# Patient Record
Sex: Male | Born: 1975 | Hispanic: No | Marital: Single | Smoking: Former smoker
Health system: Southern US, Community
[De-identification: ages and names within clinical notes are randomized; demographics above are authoritative.]

## PROBLEM LIST (undated history)

## (undated) DIAGNOSIS — D72829 Elevated white blood cell count, unspecified: Secondary | ICD-10-CM

## (undated) DIAGNOSIS — Z720 Tobacco use: Secondary | ICD-10-CM

## (undated) DIAGNOSIS — R748 Abnormal levels of other serum enzymes: Secondary | ICD-10-CM

## (undated) DIAGNOSIS — J9600 Acute respiratory failure, unspecified whether with hypoxia or hypercapnia: Secondary | ICD-10-CM

## (undated) DIAGNOSIS — R6510 Systemic inflammatory response syndrome (SIRS) of non-infectious origin without acute organ dysfunction: Secondary | ICD-10-CM

## (undated) DIAGNOSIS — R000 Tachycardia, unspecified: Secondary | ICD-10-CM

## (undated) HISTORY — DX: Tobacco use: Z72.0

## (undated) HISTORY — DX: Tachycardia, unspecified: R00.0

## (undated) HISTORY — DX: Elevated white blood cell count, unspecified: D72.829

## (undated) HISTORY — DX: Acute respiratory failure, unspecified whether with hypoxia or hypercapnia: J96.00

## (undated) HISTORY — DX: Abnormal levels of other serum enzymes: R74.8

## (undated) HISTORY — PX: OTHER SURGICAL HISTORY: SHX169

## (undated) HISTORY — DX: Systemic inflammatory response syndrome (sirs) of non-infectious origin without acute organ dysfunction: R65.10

---

## 2002-01-21 ENCOUNTER — Emergency Department (HOSPITAL_COMMUNITY): Admission: EM | Admit: 2002-01-21 | Discharge: 2002-01-21 | Payer: Self-pay | Admitting: Emergency Medicine

## 2002-01-28 ENCOUNTER — Emergency Department (HOSPITAL_COMMUNITY): Admission: EM | Admit: 2002-01-28 | Discharge: 2002-01-28 | Payer: Self-pay | Admitting: Emergency Medicine

## 2004-01-20 ENCOUNTER — Emergency Department (HOSPITAL_COMMUNITY): Admission: EM | Admit: 2004-01-20 | Discharge: 2004-01-20 | Payer: Self-pay | Admitting: Emergency Medicine

## 2011-06-09 ENCOUNTER — Inpatient Hospital Stay (HOSPITAL_BASED_OUTPATIENT_CLINIC_OR_DEPARTMENT_OTHER)
Admission: EM | Admit: 2011-06-09 | Discharge: 2011-06-20 | DRG: 871 | Disposition: A | Discharge status: Home / Self Care | Payer: Self-pay | Source: Ambulatory Visit | Attending: Pulmonary Disease | Admitting: Pulmonary Disease

## 2011-06-09 ENCOUNTER — Encounter: Payer: Self-pay | Admitting: *Deleted

## 2011-06-09 ENCOUNTER — Emergency Department (INDEPENDENT_AMBULATORY_CARE_PROVIDER_SITE_OTHER): Payer: Self-pay

## 2011-06-09 DIAGNOSIS — R748 Abnormal levels of other serum enzymes: Secondary | ICD-10-CM | POA: Diagnosis not present

## 2011-06-09 DIAGNOSIS — J189 Pneumonia, unspecified organism: Secondary | ICD-10-CM | POA: Diagnosis present

## 2011-06-09 DIAGNOSIS — R059 Cough, unspecified: Secondary | ICD-10-CM

## 2011-06-09 DIAGNOSIS — J9600 Acute respiratory failure, unspecified whether with hypoxia or hypercapnia: Secondary | ICD-10-CM | POA: Diagnosis present

## 2011-06-09 DIAGNOSIS — D72829 Elevated white blood cell count, unspecified: Secondary | ICD-10-CM | POA: Diagnosis present

## 2011-06-09 DIAGNOSIS — F17200 Nicotine dependence, unspecified, uncomplicated: Secondary | ICD-10-CM | POA: Diagnosis present

## 2011-06-09 DIAGNOSIS — J984 Other disorders of lung: Secondary | ICD-10-CM | POA: Diagnosis present

## 2011-06-09 DIAGNOSIS — A419 Sepsis, unspecified organism: Principal | ICD-10-CM | POA: Diagnosis present

## 2011-06-09 DIAGNOSIS — R0602 Shortness of breath: Secondary | ICD-10-CM

## 2011-06-09 DIAGNOSIS — E876 Hypokalemia: Secondary | ICD-10-CM | POA: Diagnosis present

## 2011-06-09 DIAGNOSIS — E869 Volume depletion, unspecified: Secondary | ICD-10-CM | POA: Diagnosis present

## 2011-06-09 DIAGNOSIS — F1210 Cannabis abuse, uncomplicated: Secondary | ICD-10-CM | POA: Diagnosis present

## 2011-06-09 DIAGNOSIS — E871 Hypo-osmolality and hyponatremia: Secondary | ICD-10-CM | POA: Diagnosis present

## 2011-06-09 DIAGNOSIS — R509 Fever, unspecified: Secondary | ICD-10-CM

## 2011-06-09 DIAGNOSIS — Z720 Tobacco use: Secondary | ICD-10-CM | POA: Diagnosis present

## 2011-06-09 DIAGNOSIS — J9601 Acute respiratory failure with hypoxia: Secondary | ICD-10-CM | POA: Diagnosis present

## 2011-06-09 DIAGNOSIS — R05 Cough: Secondary | ICD-10-CM

## 2011-06-09 DIAGNOSIS — R6510 Systemic inflammatory response syndrome (SIRS) of non-infectious origin without acute organ dysfunction: Secondary | ICD-10-CM | POA: Diagnosis present

## 2011-06-09 DIAGNOSIS — R000 Tachycardia, unspecified: Secondary | ICD-10-CM | POA: Diagnosis present

## 2011-06-09 LAB — COMPREHENSIVE METABOLIC PANEL
ALT: 30 U/L (ref 0–53)
AST: 29 U/L (ref 0–37)
Albumin: 3.4 g/dL — ABNORMAL LOW (ref 3.5–5.2)
Alkaline Phosphatase: 104 U/L (ref 39–117)
Potassium: 3.9 mEq/L (ref 3.5–5.1)
Sodium: 130 mEq/L — ABNORMAL LOW (ref 135–145)
Total Protein: 8.4 g/dL — ABNORMAL HIGH (ref 6.0–8.3)

## 2011-06-09 LAB — URINALYSIS, ROUTINE W REFLEX MICROSCOPIC
Bilirubin Urine: NEGATIVE
Glucose, UA: NEGATIVE mg/dL
Ketones, ur: 15 mg/dL — AB
pH: 6 (ref 5.0–8.0)

## 2011-06-09 LAB — CBC
Hemoglobin: 15 g/dL (ref 13.0–17.0)
MCHC: 35 g/dL (ref 30.0–36.0)
RDW: 13 % (ref 11.5–15.5)

## 2011-06-09 LAB — URINE MICROSCOPIC-ADD ON

## 2011-06-09 MED ORDER — ACETAMINOPHEN 325 MG PO TABS
650.0000 mg | ORAL_TABLET | Freq: Once | ORAL | Status: AC
  Administered 2011-06-09: 650 mg via ORAL
  Filled 2011-06-09: qty 2

## 2011-06-09 MED ORDER — DEXTROSE 5 % IV SOLN
500.0000 mg | INTRAVENOUS | Status: DC
  Administered 2011-06-09 – 2011-06-12 (×4): 500 mg via INTRAVENOUS
  Filled 2011-06-09 (×5): qty 500

## 2011-06-09 MED ORDER — SODIUM CHLORIDE 0.9 % IV SOLN: INTRAVENOUS | Status: DC

## 2011-06-09 MED ORDER — DEXTROSE 5 % IV SOLN
1.0000 g | INTRAVENOUS | Status: DC
  Administered 2011-06-09 – 2011-06-12 (×4): 1 g via INTRAVENOUS
  Filled 2011-06-09 (×8): qty 10

## 2011-06-09 MED ORDER — SODIUM CHLORIDE 0.9 % IV BOLUS (SEPSIS)
1000.0000 mL | Freq: Once | INTRAVENOUS | Status: AC
  Administered 2011-06-09: 1000 mL via INTRAVENOUS

## 2011-06-09 NOTE — ED Notes (Signed)
Pt with fever cough congestion HA body aches has been using OTC meds with no relief

## 2011-06-09 NOTE — ED Notes (Signed)
Fever NVD x 1 week pt appears weak mucus membranes dry

## 2011-06-09 NOTE — ED Provider Notes (Addendum)
History     CSN: [id number] Arrival date & time: 06/09/2011  8:13 PM   First MD Initiated Contact with Patient 06/09/11 2105      Chief Complaint  Patient presents with  . Sore Throat  . Cough  . Fever    (Consider location/radiation/quality/duration/timing/severity/associated sxs/prior treatment) Patient is a 35 y.o. male presenting with pharyngitis, cough, and fever.  Sore Throat Pertinent negatives include no chest pain, no abdominal pain and no headaches.  Cough Pertinent negatives include no chest pain, no headaches and no eye redness.  Fever Primary symptoms of the febrile illness include fever and cough. Primary symptoms do not include headaches, abdominal pain or rash.  pt w fever and cough, progressive for 1 week. Sob esp w cough. No chest pain. No sore throat, sinus drainage, muscle aches or other uri c/o. No headache. No neck pain or stiffness.  Fevers. Chills/sweats. No vomiting or diarrhea.  No hx chronic illness. No prior pna hx. +smoker.   History reviewed. No pertinent past medical history.  History reviewed. No pertinent past surgical history.  History reviewed. No pertinent family history.  History  Substance Use Topics  . Smoking status: Current Everyday Smoker  . Smokeless tobacco: Not on file  . Alcohol Use: No      Review of Systems  Constitutional: Positive for fever.  HENT: Negative for neck pain.   Eyes: Negative for redness.  Respiratory: Positive for cough.   Cardiovascular: Negative for chest pain.  Gastrointestinal: Negative for abdominal pain.  Genitourinary: Negative for flank pain.  Musculoskeletal: Negative for back pain.  Skin: Negative for rash.  Neurological: Negative for headaches.  Hematological: Does not bruise/bleed easily.  Psychiatric/Behavioral: Negative for confusion.    Allergies  Review of patient's allergies indicates no known allergies.  Home Medications   Current Outpatient Rx  Name Route Sig Dispense  Refill  . OSELTAMIVIR PHOSPHATE 75 MG PO CAPS Oral Take 75 mg by mouth 2 (two) times daily.      Juanita Laster SEVERE COLD & COUGH PO Oral Take 1 packet by mouth 3 (three) times daily.      Marland Kitchen PSEUDOEPH-DOXYLAMINE-DM-APAP 60-7.12-07-998 MG/30ML PO LIQD Oral Take 60 mLs by mouth daily.      Marland Kitchen PSEUDOEPHEDRINE-APAP-DM 40-981-19 MG/30ML PO LIQD Oral Take 45 mLs by mouth 4 (four) times daily.        BP 156/98  Pulse 138  Temp(Src) 103.4 F (39.7 C) (Oral)  Resp 26  SpO2 90%  Physical Exam  Nursing note and vitals reviewed. Constitutional: He is oriented to person, place, and time. He appears well-developed.  HENT:  Head: Normocephalic and atraumatic.  Mouth/Throat: Oropharynx is clear and moist.  Eyes: Conjunctivae are normal. Pupils are equal, round, and reactive to light.  Neck: Normal range of motion. Neck supple. No tracheal deviation present.       No stiffness or rigidity  Cardiovascular: Regular rhythm, normal heart sounds and intact distal pulses.  Exam reveals no gallop and no friction rub.   No murmur heard. Pulmonary/Chest: Effort normal. No accessory muscle usage. He has rales.  Abdominal: Bowel sounds are normal. He exhibits no distension. There is no tenderness. There is no rebound.  Musculoskeletal: Normal range of motion. He exhibits no edema and no tenderness.  Neurological: He is alert and oriented to person, place, and time.  Skin: Skin is warm and dry.  Psychiatric: He has a normal mood and affect.    ED Course  Procedures (including critical care time)  Labs Reviewed  CBC - Abnormal; Notable for the following:    WBC 16.7 (*)    All other components within normal limits  COMPREHENSIVE METABOLIC PANEL - Abnormal; Notable for the following:    Sodium 130 (*)    Chloride 90 (*)    Glucose, Bld 141 (*)    Total Protein 8.4 (*)    Albumin 3.4 (*)    All other components within normal limits  URINALYSIS, ROUTINE W REFLEX MICROSCOPIC   Dg Chest 2  View  06/09/2011  *RADIOLOGY REPORT*  Clinical Data: Cough, congestion, fever and shortness of breath.  CHEST - 2 VIEW  Comparison: None.  Findings: Multifocal infiltrates noted involving the upper and lower lung zones bilaterally.  Some of these areas are nodular in appearance and follow-up chest x-ray recommended.  There likely are tiny bilateral pleural effusions.  No edema.  Heart size and mediastinal contours are within normal limits.  IMPRESSION: Multifocal bilateral pneumonia.  Original Report Authenticated By: Reola Calkins, M.D.      MDM  Iv ns bolus. o2 . Cxr. Rocephin and zithromax iv.   [facility] admit[facility]transfer at [facility].   [facility], dr Kirtland Bouchard says admit team 3 tele [facility]    Suzi Roots, MD 06/09/11 1610  Suzi Roots, MD 06/09/11 2249

## 2011-06-10 ENCOUNTER — Encounter (HOSPITAL_COMMUNITY): Payer: Self-pay | Admitting: *Deleted

## 2011-06-10 ENCOUNTER — Inpatient Hospital Stay (HOSPITAL_COMMUNITY): Payer: Self-pay

## 2011-06-10 DIAGNOSIS — R000 Tachycardia, unspecified: Secondary | ICD-10-CM | POA: Diagnosis present

## 2011-06-10 DIAGNOSIS — J9600 Acute respiratory failure, unspecified whether with hypoxia or hypercapnia: Secondary | ICD-10-CM

## 2011-06-10 DIAGNOSIS — Z720 Tobacco use: Secondary | ICD-10-CM | POA: Diagnosis present

## 2011-06-10 DIAGNOSIS — J1100 Influenza due to unidentified influenza virus with unspecified type of pneumonia: Secondary | ICD-10-CM

## 2011-06-10 DIAGNOSIS — E869 Volume depletion, unspecified: Secondary | ICD-10-CM | POA: Diagnosis present

## 2011-06-10 DIAGNOSIS — J189 Pneumonia, unspecified organism: Secondary | ICD-10-CM | POA: Diagnosis present

## 2011-06-10 DIAGNOSIS — D72829 Elevated white blood cell count, unspecified: Secondary | ICD-10-CM | POA: Diagnosis present

## 2011-06-10 DIAGNOSIS — J9601 Acute respiratory failure with hypoxia: Secondary | ICD-10-CM | POA: Diagnosis present

## 2011-06-10 DIAGNOSIS — R509 Fever, unspecified: Secondary | ICD-10-CM | POA: Diagnosis present

## 2011-06-10 DIAGNOSIS — R6510 Systemic inflammatory response syndrome (SIRS) of non-infectious origin without acute organ dysfunction: Secondary | ICD-10-CM | POA: Diagnosis present

## 2011-06-10 DIAGNOSIS — R0902 Hypoxemia: Secondary | ICD-10-CM

## 2011-06-10 LAB — CBC
MCV: 89.5 fL (ref 78.0–100.0)
Platelets: 186 10*3/uL (ref 150–400)
RBC: 4.56 MIL/uL (ref 4.22–5.81)
RDW: 13.2 % (ref 11.5–15.5)
WBC: 19.5 10*3/uL — ABNORMAL HIGH (ref 4.0–10.5)

## 2011-06-10 LAB — MRSA PCR SCREENING: MRSA by PCR: NEGATIVE

## 2011-06-10 LAB — BLOOD GAS, ARTERIAL
Acid-Base Excess: 0.4 mmol/L (ref 0.0–2.0)
Drawn by: 33099
O2 Content: 2 L/min
Patient temperature: 98.6
pCO2 arterial: 32.5 mmHg — ABNORMAL LOW (ref 35.0–45.0)
pH, Arterial: 7.475 — ABNORMAL HIGH (ref 7.350–7.450)

## 2011-06-10 LAB — COMPREHENSIVE METABOLIC PANEL
Albumin: 2.9 g/dL — ABNORMAL LOW (ref 3.5–5.2)
BUN: 8 mg/dL (ref 6–23)
Chloride: 94 mEq/L — ABNORMAL LOW (ref 96–112)
Creatinine, Ser: 0.87 mg/dL (ref 0.50–1.35)
GFR calc Af Amer: >90 mL/min (ref >90)
Glucose, Bld: 143 mg/dL — ABNORMAL HIGH (ref 70–99)
Total Bilirubin: 0.3 mg/dL (ref 0.3–1.2)
Total Protein: 7.4 g/dL (ref 6.0–8.3)

## 2011-06-10 LAB — INFLUENZA PANEL BY PCR (TYPE A & B)
H1N1 flu by pcr: NOT DETECTED
Influenza B By PCR: NEGATIVE

## 2011-06-10 LAB — GLUCOSE, CAPILLARY

## 2011-06-10 LAB — HIV ANTIBODY (ROUTINE TESTING W REFLEX): HIV: NONREACTIVE

## 2011-06-10 LAB — PROCALCITONIN: Procalcitonin: 4.93 ng/mL

## 2011-06-10 MED ORDER — KETOROLAC TROMETHAMINE 30 MG/ML IJ SOLN
30.0000 mg | Freq: Once | INTRAMUSCULAR | Status: AC
  Administered 2011-06-10: 30 mg via INTRAVENOUS
  Filled 2011-06-10 (×2): qty 1

## 2011-06-10 MED ORDER — VANCOMYCIN HCL IN DEXTROSE 1-5 GM/200ML-% IV SOLN
1000.0000 mg | Freq: Three times a day (TID) | INTRAVENOUS | Status: DC
  Administered 2011-06-10 – 2011-06-11 (×4): 1000 mg via INTRAVENOUS
  Filled 2011-06-10 (×8): qty 200

## 2011-06-10 MED ORDER — ACETAMINOPHEN 650 MG RE SUPP: 650.0000 mg | Freq: Four times a day (QID) | RECTAL | Status: DC | PRN

## 2011-06-10 MED ORDER — OSELTAMIVIR PHOSPHATE 75 MG PO CAPS
75.0000 mg | ORAL_CAPSULE | Freq: Two times a day (BID) | ORAL | Status: DC
  Administered 2011-06-10 – 2011-06-15 (×12): 75 mg via ORAL
  Filled 2011-06-10 (×14): qty 1

## 2011-06-10 MED ORDER — ENOXAPARIN SODIUM 40 MG/0.4ML ~~LOC~~ SOLN
40.0000 mg | SUBCUTANEOUS | Status: DC
  Administered 2011-06-10 – 2011-06-16 (×5): 40 mg via SUBCUTANEOUS
  Filled 2011-06-10 (×13): qty 0.4

## 2011-06-10 MED ORDER — KETOROLAC TROMETHAMINE 15 MG/ML IJ SOLN
15.0000 mg | Freq: Four times a day (QID) | INTRAMUSCULAR | Status: DC | PRN
  Administered 2011-06-12 – 2011-06-13 (×5): 15 mg via INTRAVENOUS
  Filled 2011-06-10 (×6): qty 1

## 2011-06-10 MED ORDER — LEVALBUTEROL HCL 0.63 MG/3ML IN NEBU
0.6300 mg | INHALATION_SOLUTION | Freq: Four times a day (QID) | RESPIRATORY_TRACT | Status: DC
  Administered 2011-06-10 – 2011-06-12 (×11): 0.63 mg via RESPIRATORY_TRACT
  Filled 2011-06-10 (×15): qty 3

## 2011-06-10 MED ORDER — ONDANSETRON HCL 4 MG/2ML IJ SOLN: 4.0000 mg | Freq: Four times a day (QID) | INTRAMUSCULAR | Status: DC | PRN

## 2011-06-10 MED ORDER — KETOROLAC TROMETHAMINE 15 MG/ML IJ SOLN
15.0000 mg | Freq: Three times a day (TID) | INTRAMUSCULAR | Status: AC
  Administered 2011-06-10 – 2011-06-12 (×6): 15 mg via INTRAVENOUS
  Filled 2011-06-10 (×7): qty 1

## 2011-06-10 MED ORDER — SODIUM CHLORIDE 0.9 % IV SOLN
INTRAVENOUS | Status: DC
  Administered 2011-06-10 – 2011-06-11 (×4): via INTRAVENOUS

## 2011-06-10 MED ORDER — ONDANSETRON HCL 4 MG PO TABS: 4.0000 mg | ORAL_TABLET | Freq: Four times a day (QID) | ORAL | Status: DC | PRN

## 2011-06-10 MED ORDER — ACETAMINOPHEN 325 MG PO TABS
650.0000 mg | ORAL_TABLET | Freq: Four times a day (QID) | ORAL | Status: DC | PRN
  Administered 2011-06-10 – 2011-06-13 (×10): 650 mg via ORAL
  Filled 2011-06-10 (×12): qty 2

## 2011-06-10 NOTE — H&P (Signed)
Nathaniel Zuniga is an 35 y.o. male.   Chief Complaint: Cough, left low back and left shoulder pain. HPI: 35 year old male with no significant past medical history has been having cough with fever chills or last one week which has been progressively worse. For the last couple of days patient also has been having left-sided shoulder pain and low back pain and only takes a deep breath. He came to the ER where he was found to be febrile with temperatures around 102 103F. Chest x-ray shows multifocal pneumonia. Patient's brother recently diagnosed with flu and was taking Tamiflu. Patient took couple of his brothers medications. Despite which he did not improve. At this time patient has been admitted for further management of his pneumonia. Patient denies any headache, dizziness, nausea vomiting, abdominal pain, dysuria, discharges or diarrhea.  Past Medical History  Diagnosis Date  . Hypertension     History reviewed. No pertinent past surgical history.  Family History  Problem Relation Age of Onset  . Coronary artery disease Other    Social History:  reports that he has been smoking.  He does not have any smokeless tobacco history on file. He reports that he drinks alcohol. He reports that he does not use illicit drugs.  Allergies: No Known Allergies  Medications Prior to Admission  Medication Dose Route Frequency Provider Last Rate Last Dose  . 0.9 %  sodium chloride infusion   Intravenous Continuous Eduard Clos      . acetaminophen (TYLENOL) tablet 650 mg  650 mg Oral Q6H PRN Eduard Clos       Or  . acetaminophen (TYLENOL) suppository 650 mg  650 mg Rectal Q6H PRN Eduard Clos      . acetaminophen (TYLENOL) tablet 650 mg  650 mg Oral Once Suzi Roots, MD   650 mg at 06/09/11 2104  . azithromycin (ZITHROMAX) 500 mg in dextrose 5 % 250 mL IVPB  500 mg Intravenous Q24H Suzi Roots, MD 250 mL/hr at 06/09/11 2253 500 mg at 06/09/11 2253  . cefTRIAXone (ROCEPHIN) 1 g  in dextrose 5 % 50 mL IVPB  1 g Intravenous Q24H Suzi Roots, MD   1 g at 06/09/11 2142  . ondansetron (ZOFRAN) tablet 4 mg  4 mg Oral Q6H PRN Eduard Clos       Or  . ondansetron (ZOFRAN) injection 4 mg  4 mg Intravenous Q6H PRN Eduard Clos      . oseltamivir (TAMIFLU) capsule 75 mg  75 mg Oral BID Eduard Clos      . sodium chloride 0.9 % bolus 1,000 mL  1,000 mL Intravenous Once Suzi Roots, MD   1,000 mL at 06/09/11 2306  . DISCONTD: 0.9 %  sodium chloride infusion   Intravenous STAT Suzi Roots, MD       No current outpatient prescriptions on file as of 06/10/2011.    Results for orders placed during the hospital encounter of 06/09/11 (from the past 48 hour(s))  CBC     Status: Abnormal   Collection Time   06/09/11  9:20 PM      Component Value Range Comment   WBC 16.7 (*) 4.0 - 10.5 (K/uL)    RBC 4.88  4.22 - 5.81 (MIL/uL)    Hemoglobin 15.0  13.0 - 17.0 (g/dL)    HCT 96.0  45.4 - 09.8 (%)    MCV 87.7  78.0 - 100.0 (fL)    MCH 30.7  26.0 - 34.0 (pg)    MCHC 35.0  30.0 - 36.0 (g/dL)    RDW 96.0  45.4 - 09.8 (%)    Platelets 187  150 - 400 (K/uL)   COMPREHENSIVE METABOLIC PANEL     Status: Abnormal   Collection Time   06/09/11  9:20 PM      Component Value Range Comment   Sodium 130 (*) 135 - 145 (mEq/L)    Potassium 3.9  3.5 - 5.1 (mEq/L)    Chloride 90 (*) 96 - 112 (mEq/L)    CO2 26  19 - 32 (mEq/L)    Glucose, Bld 141 (*) 70 - 99 (mg/dL)    BUN 9  6 - 23 (mg/dL)    Creatinine, Ser 1.19  0.50 - 1.35 (mg/dL)    Calcium 9.2  8.4 - 10.5 (mg/dL)    Total Protein 8.4 (*) 6.0 - 8.3 (g/dL)    Albumin 3.4 (*) 3.5 - 5.2 (g/dL)    AST 29  0 - 37 (U/L)    ALT 30  0 - 53 (U/L)    Alkaline Phosphatase 104  39 - 117 (U/L)    Total Bilirubin 0.4  0.3 - 1.2 (mg/dL)    GFR calc non Af Amer >90  >90 (mL/min)    GFR calc Af Amer >90  >90 (mL/min)   URINALYSIS, ROUTINE W REFLEX MICROSCOPIC     Status: Abnormal   Collection Time   06/09/11 10:31 PM       Component Value Range Comment   Color, Urine YELLOW  YELLOW     APPearance CLEAR  CLEAR     Specific Gravity, Urine 1.021  1.005 - 1.030     pH 6.0  5.0 - 8.0     Glucose, UA NEGATIVE  NEGATIVE (mg/dL)    Hgb urine dipstick TRACE (*) NEGATIVE     Bilirubin Urine NEGATIVE  NEGATIVE     Ketones, ur 15 (*) NEGATIVE (mg/dL)    Protein, ur 147 (*) NEGATIVE (mg/dL)    Urobilinogen, UA 1.0  0.0 - 1.0 (mg/dL)    Nitrite NEGATIVE  NEGATIVE     Leukocytes, UA NEGATIVE  NEGATIVE    URINE MICROSCOPIC-ADD ON     Status: Normal   Collection Time   06/09/11 10:31 PM      Component Value Range Comment   Squamous Epithelial / LPF RARE  RARE     RBC / HPF 0-2  <3 (RBC/hpf)    Bacteria, UA RARE  RARE    BLOOD GAS, ARTERIAL     Status: Abnormal   Collection Time   06/10/11  1:11 AM      Component Value Range Comment   O2 Content 2.0      Delivery systems NASAL CANNULA      pH, Arterial 7.475 (*) 7.350 - 7.450     pCO2 arterial 32.5 (*) 35.0 - 45.0 (mmHg)    pO2, Arterial 53.6 (*) 80.0 - 100.0 (mmHg)    Bicarbonate 23.6  20.0 - 24.0 (mEq/L)    TCO2 24.6  0 - 100 (mmol/L)    Acid-Base Excess 0.4  0.0 - 2.0 (mmol/L)    O2 Saturation 89.6      Patient temperature 98.6      Collection site RIGHT RADIAL      Drawn by [id number]      Sample type ARTERIAL DRAW      Allens test (pass/fail) PASS  PASS     Dg Chest  2 View  06/09/2011  *RADIOLOGY REPORT*  Clinical Data: Cough, congestion, fever and shortness of breath.  CHEST - 2 VIEW  Comparison: None.  Findings: Multifocal infiltrates noted involving the upper and lower lung zones bilaterally.  Some of these areas are nodular in appearance and follow-up chest x-ray recommended.  There likely are tiny bilateral pleural effusions.  No edema.  Heart size and mediastinal contours are within normal limits.  IMPRESSION: Multifocal bilateral pneumonia.  Original Report Authenticated By: Reola Calkins, M.D.    Review of Systems  Constitutional: Positive for  fever.  HENT: Negative.   Eyes: Negative.   Respiratory: Positive for cough, sputum production and shortness of breath.   Cardiovascular: Negative.  Negative for chest pain.  Gastrointestinal: Negative.   Genitourinary: Negative.   Musculoskeletal: Positive for back pain and joint pain.  Skin: Negative.   Neurological: Negative.   Endo/Heme/Allergies: Negative.   Psychiatric/Behavioral: Negative.     Blood pressure 142/94, pulse 119, temperature 99.3 F (37.4 C), temperature source Oral, resp. rate 30, height 5\' 6"  (1.676 m), weight 70.943 kg (156 lb 6.4 oz), SpO2 94.00%. Physical Exam  Constitutional: He is oriented to person, place, and time. He appears well-developed and well-nourished.  HENT:  Head: Normocephalic and atraumatic.  Right Ear: External ear normal.  Left Ear: External ear normal.  Nose: Nose normal.  Mouth/Throat: Oropharynx is clear and moist.  Eyes: Conjunctivae are normal. Pupils are equal, round, and reactive to light.  Cardiovascular: Normal rate, regular rhythm, normal heart sounds and intact distal pulses.   Respiratory: Breath sounds normal. He has no wheezes. He has no rales. He exhibits no tenderness.  GI: Soft. Bowel sounds are normal.  Musculoskeletal: Normal range of motion.  Neurological: He is alert and oriented to person, place, and time. He has normal reflexes.  Skin: Skin is warm and dry.  Psychiatric: His behavior is normal.     Assessment/Plan #1. Pneumonia. #2. Left shoulder pain and left low back pain. #3. Tobacco abuse.  Plan As patient is tachypneic and tachycardic will admit to step down unit. We'll treat as community-acquired pneumonia with ceftriaxone and Zithromax. We will get flu PCR and place patient on droplet precautions. Will and Tamiflu for now. Will check HIV test. Patient does complain of left shoulder pain and left low back pain which are Only when he takes deep breaths. Probably it could be due to pleuritic  component.  Tracyann Duffell N. 06/10/2011, 1:31 AM

## 2011-06-10 NOTE — Progress Notes (Signed)
Admitted overnight due to acute hypoxic respiratory failure due to bilateral pneumonia and possible influenza. Very hypoxic at admission and ABG in ER demonstrated pO2 52 on 2 liters oxygen. He remains febrile with TM 103, He is also tachycardic and tachypneic and endorses pleuritic chest pain and persistant productive cough and diffuse myalgias. Will add nebs (Xopenex), check flu A+B PCR, and add NSAIDS for myalgia and fever. He endorses the SCD hose are uncomfortable so will dc in favor of daily Lovenox. Due to SIRS and persistent hypoxia and progressive leukocytosis will ask PCCM to evaluate.  As noted above the patient has failed to show significant improvement since the time of his admission. At the time of my followup evaluation this morning he remained tachypnea, tachycardic, and was barely able to complete a sentence due to these problems. At of concern that the patient wasn't significantly high risk of requiring intubation I discussed his case with PCCM and they have agreed, after evaluating him at bedside, to transfer him to the ICU under their care.  I have personally examined this patient and reviewed the entire database. I have reviewed the above note, made any necessary editorial changes, and agree with its content.  Lonia Blood, MD [facility]

## 2011-06-10 NOTE — ED Notes (Signed)
Care plan reviewed with pt and family Silvio Pate RN at side

## 2011-06-10 NOTE — Consult Note (Signed)
HISTORY of PRESENT ILLNESS:  Nathaniel Zuniga is a 35 y.o. male, current smoker, with no PMH admitted on 06/09/2011 with Acute respiratory failure with hypoxia in the setting of multi-focal bilateral PNA.  He indicates 9/22 he began with body aches, subjective fevers, cough with brown sputum production and progressively has felt worse.  He also endorses back pain L>R, pain on inspiration. His twin brother recently was diagnosed with the flu and he attempted to share medications with his brother without relief.  ED evaluation demonstrated Temp of 103, CXR with multifocal areas concerning for PNA and PO2 of 53 on Reader O2.  Smoked marijuana a few days after Thanksgiving without relief of symptoms.  PT admitted per [facility].  Currently requiring 4L O2 to maintain sats of 90-92%, tachypnea and back pain.    LINES / TUBES  CULTURES 11/30 Flu A / B>>>neg 11/30 H1N1>>>neg 11/30 MRSA PCR>>>neg 11/30 HIV>>> 11/30 BCx2>>>neg 11/30 UA>>>neg  ABX 11/29 Rocephin>>> 11/29 Zithromax>>> 11/29 Tamiflu>>>  BEST PRACITICE GI: not indicated DVT: lovenox Nutrition: po   KEY EVENTS / STUDIES 11/30 tx to ICU   History reviewed. No pertinent past medical history.  Discussed hx of HTN with patient.  He has never been on HTN medications.  He was told his BP was high when he had a broken finger set at an urgent care.   Past Surgical History  Procedure Date  . Broken nose repair     Family History  Problem Relation Age of Onset  . Coronary artery disease Other      reports that he has been smoking.  He does not have any smokeless tobacco history on file. He reports that he drinks alcohol. He reports that he does not use illicit drugs.  No Known Allergies  Medications Prior to Admission  Medication Dose Route Frequency Provider Last Rate Last Dose  . 0.9 %  sodium chloride infusion   Intravenous Continuous Arshad N. Kakrakandy 150 mL/hr at 06/10/11 0708    . acetaminophen (TYLENOL) tablet 650 mg  650 mg Oral  Q6H PRN Eduard Clos   650 mg at 06/10/11 0340   Or  . acetaminophen (TYLENOL) suppository 650 mg  650 mg Rectal Q6H PRN Eduard Clos      . acetaminophen (TYLENOL) tablet 650 mg  650 mg Oral Once Suzi Roots, MD   650 mg at 06/09/11 2104  . azithromycin (ZITHROMAX) 500 mg in dextrose 5 % 250 mL IVPB  500 mg Intravenous Q24H Suzi Roots, MD 250 mL/hr at 06/09/11 2253 500 mg at 06/09/11 2253  . cefTRIAXone (ROCEPHIN) 1 g in dextrose 5 % 50 mL IVPB  1 g Intravenous Q24H Suzi Roots, MD   1 g at 06/09/11 2142  . enoxaparin (LOVENOX) injection 40 mg  40 mg Subcutaneous Q24H Allison L. Rennis Harding, NP      . ketorolac (TORADOL) 15 MG/ML injection 15 mg  15 mg Intravenous Q8H Allison L. Rennis Harding, NP       Followed by  . ketorolac (TORADOL) 15 MG/ML injection 15 mg  15 mg Intravenous Q6H PRN Allison L. Rennis Harding, NP      . ketorolac (TORADOL) 30 MG/ML injection 30 mg  30 mg Intravenous Once Allison L. Rennis Harding, NP      . levalbuterol [facility]) nebulizer solution 0.63 mg  0.63 mg Nebulization Q6H Allison L. Rennis Harding, NP   0.63 mg at 06/10/11 0929  . ondansetron (ZOFRAN) tablet 4 mg  4 mg Oral Q6H PRN  Eduard Clos       Or  . ondansetron (ZOFRAN) injection 4 mg  4 mg Intravenous Q6H PRN Eduard Clos      . oseltamivir (TAMIFLU) capsule 75 mg  75 mg Oral BID Eduard Clos      . sodium chloride 0.9 % bolus 1,000 mL  1,000 mL Intravenous Once Suzi Roots, MD   1,000 mL at 06/09/11 2306  . DISCONTD: 0.9 %  sodium chloride infusion   Intravenous STAT Suzi Roots, MD       No current outpatient prescriptions on file as of 06/10/2011.    ROS: Constitutional:   No  weight loss, night sweats.  Indicates fevers, chills, malaise. HEENT:  No sneezing, itching, ear ache, nasal congestion, post nasal drip.  Positive for HA, sore throat, cough.   CV: Denies PND, swelling in lower extremities, anasarca, dizziness, palpitations.    GI  No heartburn, indigestion, abdominal pain,  nausea, vomiting,change in bowel habits, loss of appetite.  Positive diarrhea.   Resp: See HPI.    Skin: no rash or lesions.  GU: no dysuria, change in color of urine, no urgency or frequency.  No flank pain.  MS:  No joint pain or swelling.  No decreased range of motion.  No back pain.  Psych:  No change in mood or affect. No depression or anxiety.  No memory loss.   Blood pressure 144/89, pulse 120, temperature 100.3 F (37.9 C), temperature source Oral, resp. rate 33, height 5\' 6"  (1.676 m), weight 162 lb 0.6 oz (73.5 kg), SpO2 92.00%. PHYICAL EXAM: General: wdwn adult male, sitting upright, labored breathing Neuro: AAOx4, speech clear, MAE CV: s1s2 tachy, regular PULM: resp's shallow, labored insp crackles worse L base EA:VWUJ, soft, bs x4 active Extremities: hot to touch, Derm: diaphoretic  LABS BMET    Component Value Date/Time   NA 132* 06/10/2011 0220   K 3.8 06/10/2011 0220   CL 94* 06/10/2011 0220   CO2 24 06/10/2011 0220   GLUCOSE 143* 06/10/2011 0220   BUN 8 06/10/2011 0220   CREATININE 0.87 06/10/2011 0220   CALCIUM 8.5 06/10/2011 0220   GFRNONAA >90 06/10/2011 0220   GFRAA >90 06/10/2011 0220    CBC    Component Value Date/Time   WBC 19.5* 06/10/2011 0220   RBC 4.56 06/10/2011 0220   HGB 14.3 06/10/2011 0220   HCT 40.8 06/10/2011 0220   PLT 186 06/10/2011 0220   MCV 89.5 06/10/2011 0220   MCH 31.4 06/10/2011 0220   MCHC 35.0 06/10/2011 0220   RDW 13.2 06/10/2011 0220     ABG    Component Value Date/Time   PHART 7.475* 06/10/2011 0111   HCO3 23.6 06/10/2011 0111   TCO2 24.6 06/10/2011 0111   O2SAT 89.6 06/10/2011 0111    RADIOLOGIC DATA 11/29 CXR>>>mild bilateral patchy, multifocal airspace disease 11/30 CXR>>> progression from 11/29 xray eval   ASSESSMENT/PLAN:  Pneumonia / Hypoxia -secondary to bilateral progression of airspace disease.  Flu swab neg for A, B, H1N1.  CXR with progression of bilateral infiltrates.  PLAN: -continue  tamiflu -rocephin / azithro -add vanc empirically now -transfer to ICU, concerned he will fatigue and require intubation -follow cultures, HIV panel -if HIV positive would start empiric PCP treatment -PCT in progress -O2 to keep saturations >93%  SIRS -secondary to PNA / ? Viral illness.  Lactic acid 1.7 PLAN: -see above  Pain -in the setting of PNA. PLAN: -continue toradol   Tobacco  Abuse -current smoker  For 18 pack year hx.  PLAN: -smoking cessation when status improved.  Proteinuria -pt with questionable hx of HTN.  Was dx at an urgent care when he was having broken finger set.   PLAN: -f/u HTN as outpt    Canary Brim, NP-C [facility] Pgr: [phone]  06/10/2011   I have seen and agree with the plan as outlined above.  He has severe community acquired pneumonia with a flu like illness.  Would empirically cover CA-MRSA as well as other common pathogens in the ICU for close monitoring.  Hancel Ion

## 2011-06-10 NOTE — Progress Notes (Signed)
Utilization Review Completed.Latravia Southgate T11/30/2012   

## 2011-06-10 NOTE — Progress Notes (Signed)
ANTIBIOTIC CONSULT NOTE - INITIAL  Pharmacy Consult for vancomycin Indication: pneumonia  No Known Allergies  Patient Measurements: Height: 5\' 6"  (167.6 cm) Weight: 162 lb 0.6 oz (73.5 kg) IBW/kg (Calculated) : 63.8  Adjusted Body Weight:   Vital Signs: Temp: 98.9 F (37.2 C) (11/30 1158) Temp src: Oral (11/30 1158) BP: 148/93 mmHg (11/30 1158) Pulse Rate: 117  (11/30 1158) Intake/Output from previous day: 11/29 0701 - 11/30 0700 In: 570 [P.O.:120; I.V.:450] Out: 450 [Urine:450] Intake/Output from this shift: Total I/O In: 600 [I.V.:600] Out: -   Labs:  Basename 06/10/11 0220 06/09/11 2120  WBC 19.5* 16.7*  HGB 14.3 15.0  PLT 186 187  LABCREA -- --  CREATININE 0.87 0.80   Estimated Creatinine Clearance: 108 ml/min (by C-G formula based on Cr of 0.87). No results found for this basename: VANCOTROUGH:2,VANCOPEAK:2,VANCORANDOM:2,GENTTROUGH:2,GENTPEAK:2,GENTRANDOM:2,TOBRATROUGH:2,TOBRAPEAK:2,TOBRARND:2,AMIKACINPEAK:2,AMIKACINTROU:2,AMIKACIN:2, in the last 72 hours   Microbiology: Recent Results (from the past 720 hour(s))  MRSA PCR SCREENING     Status: Normal   Collection Time   06/10/11  3:27 AM      Component Value Range Status Comment   MRSA by PCR NEGATIVE  NEGATIVE  Final     Medical History: History reviewed. No pertinent past medical history.  Medications:  Scheduled:    . acetaminophen  650 mg Oral Once  . azithromycin  500 mg Intravenous Q24H  . cefTRIAXone (ROCEPHIN)  IV  1 g Intravenous Q24H  . enoxaparin (LOVENOX) injection  40 mg Subcutaneous Q24H  . ketorolac  15 mg Intravenous Q8H  . ketorolac  30 mg Intravenous Once  . levalbuterol  0.63 mg Nebulization Q6H  . oseltamivir  75 mg Oral BID  . sodium chloride  1,000 mL Intravenous Once  . vancomycin  1,000 mg Intravenous Q8H  . DISCONTD: sodium chloride   Intravenous STAT   Infusions:    . sodium chloride 150 mL/hr at 06/10/11 1359   Assessment: 35 yo who was admitted for CAP. He was  getting Tamiflu from his brother who recently gotten the flu. Flu PCR is neg here. Vanc will added to azith/rocephin.  Goal of Therapy:  Vancomycin trough level 15-20 mcg/ml  Plan:  1. Vanc 1g IV q8 2. F/u vanc trough if therapy continue  Ulyses Southward [facility] 06/10/2011,2:03 PM

## 2011-06-11 LAB — CBC
HCT: 34.8 % — ABNORMAL LOW (ref 39.0–52.0)
Hemoglobin: 12.2 g/dL — ABNORMAL LOW (ref 13.0–17.0)
MCH: 31.4 pg (ref 26.0–34.0)
MCHC: 35.1 g/dL (ref 30.0–36.0)
MCV: 89.5 fL (ref 78.0–100.0)
RBC: 3.89 MIL/uL — ABNORMAL LOW (ref 4.22–5.81)

## 2011-06-11 LAB — COMPREHENSIVE METABOLIC PANEL
ALT: 113 U/L — ABNORMAL HIGH (ref 0–53)
Alkaline Phosphatase: 113 U/L (ref 39–117)
BUN: 9 mg/dL (ref 6–23)
CO2: 24 mEq/L (ref 19–32)
Calcium: 8 mg/dL — ABNORMAL LOW (ref 8.4–10.5)
GFR calc Af Amer: >90 mL/min (ref >90)
GFR calc non Af Amer: >90 mL/min (ref >90)
Glucose, Bld: 137 mg/dL — ABNORMAL HIGH (ref 70–99)
Sodium: 133 mEq/L — ABNORMAL LOW (ref 135–145)
Total Protein: 6.7 g/dL (ref 6.0–8.3)

## 2011-06-11 LAB — DIFFERENTIAL
Basophils Relative: 1 % (ref 0–1)
Eosinophils Absolute: 0 10*3/uL (ref 0.0–0.7)
Eosinophils Relative: 0 % (ref 0–5)
Lymphs Abs: 1.6 10*3/uL (ref 0.7–4.0)
Monocytes Absolute: 2.6 10*3/uL — ABNORMAL HIGH (ref 0.1–1.0)
Neutro Abs: 15.4 10*3/uL — ABNORMAL HIGH (ref 1.7–7.7)
Neutrophils Relative %: 78 % — ABNORMAL HIGH (ref 43–77)

## 2011-06-11 MED ORDER — POTASSIUM CHLORIDE CRYS ER 20 MEQ PO TBCR
40.0000 meq | EXTENDED_RELEASE_TABLET | Freq: Once | ORAL | Status: AC
  Administered 2011-06-11: 40 meq via ORAL

## 2011-06-11 MED ORDER — POTASSIUM CHLORIDE CRYS ER 20 MEQ PO TBCR
EXTENDED_RELEASE_TABLET | ORAL | Status: AC
  Filled 2011-06-11: qty 2

## 2011-06-11 MED ORDER — ALUM & MAG HYDROXIDE-SIMETH 200-200-20 MG/5ML PO SUSP: 30.0000 mL | ORAL | Status: DC | PRN

## 2011-06-11 NOTE — Progress Notes (Signed)
Report received from offgoing RN of droplet precautions being discontinued prior to patient transferring from department 2600.

## 2011-06-11 NOTE — Progress Notes (Signed)
HISTORY of PRESENT ILLNESS:  Nathaniel Zuniga is a 34 y.o. male, current smoker, with no PMH admitted on 06/09/2011 with Acute respiratory failure with hypoxia in the setting of multi-focal bilateral PNA.  He indicates 9/22 he began with body aches, subjective fevers, cough with brown sputum production and progressively has felt worse.  He also endorses back pain L>R, pain on inspiration. His twin brother recently was diagnosed with the flu and he attempted to share medications with his brother without relief.  ED evaluation demonstrated Temp of 103, CXR with multifocal areas concerning for PNA and PO2 of 53 on [place] O2.  Smoked marijuana a few days after Thanksgiving without relief of symptoms.  PT admitted per [facility].  Currently requiring 4L O2 to maintain sats of 90-92%, tachypnea and back pain.    LINES / TUBES  CULTURES 11/30 Flu A / B>>>neg 11/30 H1N1>>>neg 11/30 MRSA PCR>>>neg 11/30 HIV>>>NR  11/30 BCx2>>>neg 11/30 UA>>>neg  ABX 11/29 Rocephin>>> 11/29 Zithromax>>> 11/29 Tamiflu>>> 11/30 Vanc>>  BEST PRACITICE GI: not indicated DVT: lovenox Nutrition: po   KEY EVENTS / STUDIES 11/30 tx to ICU  Overnight:   sats adequate on 3 l/m  Temp tr down  Eating reg diet   Blood pressure 133/92, pulse 106, temperature 98.1 F (36.7 C), temperature source Oral, resp. rate 32, height 5\' 6"  (1.676 m), weight 73.5 kg (162 lb 0.6 oz), SpO2 94.00%.  PHYICAL EXAM:  Gen: well appearing, speaking in full sentences, no acute distress HEENT: NCAT, PERRL, EOMi, OP clear, neck supple without masses PULM: Insp crackles improved from prior exam CV: RRR, no mgr, no JVD AB: BS+, soft, nontender, no hsm Ext: warm, no edema, no clubbing, no cyanosis Derm: no rash or skin breakdown Neuro: A&Ox4, CN II-XII intact, strength 5/5 in all 4 extremeties   LABS BMET    Component Value Date/Time   NA 133* 06/11/2011 0500   K 3.2* 06/11/2011 0500   CL 99 06/11/2011 0500   CO2 24 06/11/2011 0500   GLUCOSE  137* 06/11/2011 0500   BUN 9 06/11/2011 0500   CREATININE 0.78 06/11/2011 0500   CALCIUM 8.0* 06/11/2011 0500   GFRNONAA >90 06/11/2011 0500   GFRAA >90 06/11/2011 0500    CBC    Component Value Date/Time   WBC 19.8* 06/11/2011 0500   RBC 3.89* 06/11/2011 0500   HGB 12.2* 06/11/2011 0500   HCT 34.8* 06/11/2011 0500   PLT 197 06/11/2011 0500   MCV 89.5 06/11/2011 0500   MCH 31.4 06/11/2011 0500   MCHC 35.1 06/11/2011 0500   RDW 13.5 06/11/2011 0500   LYMPHSABS 1.6 06/11/2011 0500   MONOABS 2.6* 06/11/2011 0500   EOSABS 0.0 06/11/2011 0500   BASOSABS 0.2* 06/11/2011 0500     ABG    Component Value Date/Time   PHART 7.475* 06/10/2011 0111   HCO3 23.6 06/10/2011 0111   TCO2 24.6 06/10/2011 0111   O2SAT 89.6 06/10/2011 0111    RADIOLOGIC DATA 11/29 CXR>>>mild bilateral patchy, multifocal airspace disease 11/30 CXR>>> progression from 11/29 xray eval 12/1 CXR>>no xr   ASSESSMENT/PLAN:  Pneumonia / Hypoxia -secondary to bilateral progression of airspace disease.  Flu swab neg for A, B, H1N1.  CXR with progression of bilateral infiltrates.  PLAN: -continue tamiflu  -rocephin / azithro -vanc empirically, stop today, low threshold to restart if clinical status changes    -follow cultures, HIV panel is neg  -O2 to keep saturations >93%  SIRS -secondary to PNA / ? Viral illness.  Lactic  acid 1.7 PLAN: -see above -decrease IVF kvo (+ bal)   Pain -in the setting of PNA. PLAN: -continue toradol prn   Tobacco Abuse -current smoker  For 18 pack year hx.  PLAN: -smoking cessation when status improved.  Proteinuria -pt with questionable hx of HTN.  Was dx at an urgent care when he was having broken finger set.   PLAN: -f/u HTN as outpt  Hypokalemia  Replace     06/11/2011   I   Zuniga,TAMMY NP , PCCM   Nathaniel Zuniga

## 2011-06-11 NOTE — Progress Notes (Signed)
Patient transferred to 4502. Report given to Terri, Charity fundraiser. Patient placed in 4500 bed, oxygen hooked and placed on 2L. Vancomycin 1gm running as secondary. Last pain meds given tylenol at 1733. Nurse notified.

## 2011-06-12 ENCOUNTER — Inpatient Hospital Stay (HOSPITAL_COMMUNITY): Payer: Self-pay

## 2011-06-12 DIAGNOSIS — J1100 Influenza due to unidentified influenza virus with unspecified type of pneumonia: Secondary | ICD-10-CM

## 2011-06-12 DIAGNOSIS — R0902 Hypoxemia: Secondary | ICD-10-CM

## 2011-06-12 DIAGNOSIS — J9600 Acute respiratory failure, unspecified whether with hypoxia or hypercapnia: Secondary | ICD-10-CM

## 2011-06-12 LAB — URINALYSIS, ROUTINE W REFLEX MICROSCOPIC
Ketones, ur: NEGATIVE mg/dL
Leukocytes, UA: NEGATIVE
Nitrite: NEGATIVE
Protein, ur: 30 mg/dL — AB
pH: 6.5 (ref 5.0–8.0)

## 2011-06-12 LAB — BASIC METABOLIC PANEL
CO2: 23 mEq/L (ref 19–32)
Creatinine, Ser: 0.75 mg/dL (ref 0.50–1.35)
GFR calc Af Amer: >90 mL/min (ref >90)
GFR calc non Af Amer: >90 mL/min (ref >90)
Potassium: 2.9 mEq/L — ABNORMAL LOW (ref 3.5–5.1)
Sodium: 133 mEq/L — ABNORMAL LOW (ref 135–145)

## 2011-06-12 LAB — URINE MICROSCOPIC-ADD ON

## 2011-06-12 LAB — CBC
MCHC: 36.4 g/dL — ABNORMAL HIGH (ref 30.0–36.0)
Platelets: 262 10*3/uL (ref 150–400)
RDW: 13.5 % (ref 11.5–15.5)
WBC: 20.2 10*3/uL — ABNORMAL HIGH (ref 4.0–10.5)

## 2011-06-12 MED ORDER — POTASSIUM CHLORIDE 10 MEQ/100ML IV SOLN
10.0000 meq | INTRAVENOUS | Status: AC
  Administered 2011-06-12 – 2011-06-13 (×4): 10 meq via INTRAVENOUS
  Filled 2011-06-12 (×4): qty 100

## 2011-06-12 MED ORDER — ACETAMINOPHEN 325 MG PO TABS
650.0000 mg | ORAL_TABLET | ORAL | Status: AC
  Administered 2011-06-12: 650 mg via ORAL

## 2011-06-12 MED ORDER — POTASSIUM CHLORIDE CRYS ER 20 MEQ PO TBCR
EXTENDED_RELEASE_TABLET | ORAL | Status: AC
  Filled 2011-06-12: qty 2

## 2011-06-12 MED ORDER — LEVALBUTEROL HCL 0.63 MG/3ML IN NEBU
0.6300 mg | INHALATION_SOLUTION | Freq: Two times a day (BID) | RESPIRATORY_TRACT | Status: DC
  Administered 2011-06-13: 0.63 mg via RESPIRATORY_TRACT
  Filled 2011-06-12 (×3): qty 3

## 2011-06-12 NOTE — Progress Notes (Signed)
Subjective: Febrile but comfortable.  Objective: Weight change:   Intake/Output Summary (Last 24 hours) at 06/12/11 1719 Last data filed at 06/11/11 2310  Gross per 24 hour  Intake 473.97 ml  Output      0 ml  Net 473.97 ml  Gen: well appearing, no acute distress, PULM: Good air entry bilateral w/o wheeze or rhonchi.  CV: RRR, no mgr, no JVD  AB: BS+, soft, , no hsm  Ext: warm, no edema, no clubbing, no cyanosis  Derm: no rash or skin breakdown Neuro alert and oriented x3 he no focal deficiTS     Lab Results: Results for orders placed during the hospital encounter of 06/09/11 (from the past 24 hour(s))  CBC     Status: Abnormal   Collection Time   06/12/11  4:50 PM      Component Value Range   WBC 20.2 (*) 4.0 - 10.5 (K/uL)   RBC 4.12 (*) 4.22 - 5.81 (MIL/uL)   Hemoglobin 13.2  13.0 - 17.0 (g/dL)   HCT 16.1 (*) 09.6 - 52.0 (%)   MCV 88.1  78.0 - 100.0 (fL)   MCH 32.0  26.0 - 34.0 (pg)   MCHC 36.4 (*) 30.0 - 36.0 (g/dL)   RDW 04.5  40.9 - 81.1 (%)   Platelets 262  150 - 400 (K/uL)  BASIC METABOLIC PANEL     Status: Abnormal   Collection Time   06/12/11  4:50 PM      Component Value Range   Sodium 133 (*) 135 - 145 (mEq/L)   Potassium 2.9 (*) 3.5 - 5.1 (mEq/L)   Chloride 98  96 - 112 (mEq/L)   CO2 23  19 - 32 (mEq/L)   Glucose, Bld 119 (*) 70 - 99 (mg/dL)   BUN 9  6 - 23 (mg/dL)   Creatinine, Ser 9.14  0.50 - 1.35 (mg/dL)   Calcium 8.4  8.4 - 78.2 (mg/dL)   GFR calc non Af Amer >90  >90 (mL/min)   GFR calc Af Amer >90  >90 (mL/min)     Micro Results: Recent Results (from the past 240 hour(s))  CULTURE, BLOOD (ROUTINE X 2)     Status: Normal (Preliminary result)   Collection Time   06/10/11  2:12 AM      Component Value Range Status Comment   Specimen Description BLOOD RIGHT FOREARM   Final    Special Requests BOTTLES DRAWN AEROBIC AND ANAEROBIC 5CC EACH   Final    Setup Time [id number]   Final    Culture     Final    Value:        BLOOD CULTURE RECEIVED  NO GROWTH TO DATE CULTURE WILL BE HELD FOR 5 DAYS BEFORE ISSUING A FINAL NEGATIVE REPORT   Report Status PENDING   Incomplete   CULTURE, BLOOD (ROUTINE X 2)     Status: Normal (Preliminary result)   Collection Time   06/10/11  2:45 AM      Component Value Range Status Comment   Specimen Description BLOOD RIGHT ARM   Final    Special Requests BOTTLES DRAWN AEROBIC AND ANAEROBIC 10CC EACH   Final    Setup Time [id number]   Final    Culture     Final    Value:        BLOOD CULTURE RECEIVED NO GROWTH TO DATE CULTURE WILL BE HELD FOR 5 DAYS BEFORE ISSUING A FINAL NEGATIVE REPORT   Report Status PENDING   Incomplete  MRSA PCR SCREENING     Status: Normal   Collection Time   06/10/11  3:27 AM      Component Value Range Status Comment   MRSA by PCR NEGATIVE  NEGATIVE  Final     Studies/Results: Dg Chest 2 View  06/09/2011  *RADIOLOGY REPORT*  Clinical Data: Cough, congestion, fever and shortness of breath.  CHEST - 2 VIEW  Comparison: None.  Findings: Multifocal infiltrates noted involving the upper and lower lung zones bilaterally.  Some of these areas are nodular in appearance and follow-up chest x-ray recommended.  There likely are tiny bilateral pleural effusions.  No edema.  Heart size and mediastinal contours are within normal limits.  IMPRESSION: Multifocal bilateral pneumonia.  Original Report Authenticated By: Reola Calkins, M.D.   Dg Chest Port 1 View  06/12/2011  *RADIOLOGY REPORT*  Clinical Data: Pneumonia.  PORTABLE CHEST - 1 VIEW  Comparison: 06/10/2011  Findings: Patchy bilateral airspace disease again noted, unchanged. There is consolidation in both lower lobes and upper lobes. Suspect trace effusions.  IMPRESSION: No significant change.  Original Report Authenticated By: Cyndie Chime, M.D.   Dg Chest Port 1 View  06/10/2011  *RADIOLOGY REPORT*  Clinical Data: Pneumonia follow up.  PORTABLE CHEST - 1 VIEW  Comparison: 06/09/2011.  Findings: Progressive bilateral patchy  consolidation consistent with multifocal pneumonia possibly with associated atelectasis in the lung bases.  Recommend follow-up until complete clearance to exclude underlying mass.  No gross pneumothorax.  Central pulmonary vascular prominence.  Heart size top normal.  IMPRESSION: Progressive bilateral multifocal pneumonia suspected as noted above.  Original Report Authenticated By: Fuller Canada, M.D.   Medications: Scheduled Meds:   . acetaminophen  650 mg Oral STAT  . azithromycin  500 mg Intravenous Q24H  . cefTRIAXone (ROCEPHIN)  IV  1 g Intravenous Q24H  . enoxaparin (LOVENOX) injection  40 mg Subcutaneous Q24H  . ketorolac  15 mg Intravenous Q8H  . levalbuterol  0.63 mg Nebulization Q6H  . oseltamivir  75 mg Oral BID  . DISCONTD: vancomycin  1,000 mg Intravenous Q8H   Continuous Infusions:   . sodium chloride 150 mL/hr at 06/11/11 0901   PRN Meds:.acetaminophen, acetaminophen, alum & mag hydroxide-simeth, ketorolac, ondansetron (ZOFRAN) IV, ondansetron  Assessment/Plan: Pneumonia / Hypoxia -secondary to bilateral progression of airspace disease. Flu swab neg for A, B, H1N1. CXR with progression of bilateral infiltrates. This is influenza most likely, despite negative labs.  PLAN: -continue tamiflu , chest x-ray this a.m. no new changes -rocephin / azithro, WILL REPEAT CXR IN AM.  Hypokalemia will be repleted Hyponatremia most likely secondary to lung process. Tobacco abuse current smoker,  refusing nicotine patch.   LOS: 3 days   Nathaniel Zuniga 06/12/2011, 5:19 PM

## 2011-06-12 NOTE — Progress Notes (Signed)
HISTORY of PRESENT ILLNESS:  Nathaniel Zuniga is a 35 y.o. male, current smoker, with no PMH admitted on 06/09/2011 with Acute respiratory failure with hypoxia in the setting of multi-focal bilateral PNA.  He indicates 9/22 he began with body aches, subjective fevers, cough with brown sputum production and progressively has felt worse.  He also endorses back pain L>R, pain on inspiration. His twin brother recently was diagnosed with the flu and he attempted to share medications with his brother without relief.  ED evaluation demonstrated Temp of 103, CXR with multifocal areas concerning for PNA and PO2 of 53 on [place] O2.  Smoked marijuana a few days after Thanksgiving without relief of symptoms.  PT admitted per [facility].  Currently requiring 4L O2 to maintain sats of 90-92%, tachypnea and back pain.    PCCM-WEEKEND Cover-  Slow progress, still feeling wiped out. Active cough is mostly dry. Tussive soreness left pectoral area. Diarrhea fading.   LINES / TUBES  CULTURES 11/30 Flu A / B>>>neg 11/30 H1N1>>>neg 11/30 MRSA PCR>>>neg 11/30 HIV>>>NR  11/30 BCx2>>>neg 11/30 UA>>>neg  ABX 11/29 Rocephin>>> 11/29 Zithromax>>> 11/29 Tamiflu>>> 11/30 Vanc>>  BEST PRACITICE GI: not indicated DVT: lovenox Nutrition: po   KEY EVENTS / STUDIES 11/30 tx to ICU. 06/11/11- to floor.  Overnight:   sats adequate on 3 l/m  Temp tr down  Eating reg diet   Blood pressure 142/82, pulse 103, temperature 100.5 F (38.1 C), temperature source Axillary, resp. rate 24, height 5\' 6"  (1.676 m), weight 73.5 kg (162 lb 0.6 oz), SpO2 95.00%.  PHYICAL EXAM:  Gen: well appearing, speaking in short sentences, no acute distress, tired.  HEENT: NCAT, PERRL, EOMi, OP clear, neck supple without masses PULM: ICoughing w/o wheeze or rhonchi. CV: RRR, no mgr, no JVD AB: BS+, soft, , no hsm Ext: warm, no edema, no clubbing, no cyanosis Derm: no rash or skin breakdown Neuro: A&Ox4, CN II-XII intact, strength 5/5 in all 4  extremities. Sitting at bedside for neb.   LABS BMET    Component Value Date/Time   NA 133* 06/11/2011 0500   K 3.2* 06/11/2011 0500   CL 99 06/11/2011 0500   CO2 24 06/11/2011 0500   GLUCOSE 137* 06/11/2011 0500   BUN 9 06/11/2011 0500   CREATININE 0.78 06/11/2011 0500   CALCIUM 8.0* 06/11/2011 0500   GFRNONAA >90 06/11/2011 0500   GFRAA >90 06/11/2011 0500    CBC    Component Value Date/Time   WBC 19.8* 06/11/2011 0500   RBC 3.89* 06/11/2011 0500   HGB 12.2* 06/11/2011 0500   HCT 34.8* 06/11/2011 0500   PLT 197 06/11/2011 0500   MCV 89.5 06/11/2011 0500   MCH 31.4 06/11/2011 0500   MCHC 35.1 06/11/2011 0500   RDW 13.5 06/11/2011 0500   LYMPHSABS 1.6 06/11/2011 0500   MONOABS 2.6* 06/11/2011 0500   EOSABS 0.0 06/11/2011 0500   BASOSABS 0.2* 06/11/2011 0500   Meds Reviewed by me.  ABG    Component Value Date/Time   PHART 7.475* 06/10/2011 0111   HCO3 23.6 06/10/2011 0111   TCO2 24.6 06/10/2011 0111   O2SAT 89.6 06/10/2011 0111    RADIOLOGIC DATA 11/29 CXR>>>mild bilateral patchy, multifocal airspace disease 11/30 CXR>>> progression from 11/29 xray eval 12/1 CXR>>no xr  12/2-CXR> shifting multilobar infiltrates, clearer left base, denser RUL. Images reviewed. Worse- not better.  ASSESSMENT/PLAN:  Pneumonia / Hypoxia -secondary to bilateral progression of airspace disease.  Flu swab neg for A, B, H1N1.  CXR with progression  of bilateral infiltrates. This is influenza most likely, despite negative labs.  PLAN: -continue tamiflu  -rocephin / azithro -vanc empirically, stop 12/1, low threshold to restart if clinical status changes Downstairs tomorrow for CXR, w/ CBC    -follow cultures, HIV panel is neg  -O2 to keep saturations >93%  SIRS -secondary to PNA / ? Viral illness.  Lactic acid 1.7 PLAN: -see above -decrease IVF kvo (+ bal)   Pain -in the setting of PNA. PLAN: -continue toradol prn   Tobacco Abuse -current smoker  For 18 pack year hx.  PLAN: -smoking  cessation when status improved.  Proteinuria -pt with questionable hx of HTN.  Was dx at an urgent care when he was having broken finger set.   PLAN: -f/u HTN as outpt  Hypokalemia  Replace     06/12/2011   I   Waymon Budge NP , PCCM   Waymon Budge

## 2011-06-13 ENCOUNTER — Inpatient Hospital Stay (HOSPITAL_COMMUNITY): Payer: Self-pay

## 2011-06-13 DIAGNOSIS — R509 Fever, unspecified: Secondary | ICD-10-CM

## 2011-06-13 DIAGNOSIS — J189 Pneumonia, unspecified organism: Secondary | ICD-10-CM

## 2011-06-13 LAB — CBC
Hemoglobin: 12.8 g/dL — ABNORMAL LOW (ref 13.0–17.0)
MCHC: 35.9 g/dL (ref 30.0–36.0)
RBC: 4.03 MIL/uL — ABNORMAL LOW (ref 4.22–5.81)
WBC: 22.3 10*3/uL — ABNORMAL HIGH (ref 4.0–10.5)

## 2011-06-13 LAB — BASIC METABOLIC PANEL
GFR calc non Af Amer: >90 mL/min (ref >90)
Glucose, Bld: 120 mg/dL — ABNORMAL HIGH (ref 70–99)
Potassium: 3.2 mEq/L — ABNORMAL LOW (ref 3.5–5.1)
Sodium: 133 mEq/L — ABNORMAL LOW (ref 135–145)

## 2011-06-13 LAB — DIFFERENTIAL
Basophils Relative: 1 % (ref 0–1)
Eosinophils Absolute: 0 10*3/uL (ref 0.0–0.7)
Eosinophils Relative: 0 % (ref 0–5)
Lymphocytes Relative: 7 % — ABNORMAL LOW (ref 12–46)
Monocytes Relative: 16 % — ABNORMAL HIGH (ref 3–12)
Neutrophils Relative %: 76 % (ref 43–77)

## 2011-06-13 LAB — SEDIMENTATION RATE: Sed Rate: 110 mm/hr — ABNORMAL HIGH (ref 0–16)

## 2011-06-13 MED ORDER — DEXTROSE 5 % IV SOLN
1.0000 g | INTRAVENOUS | Status: DC
  Administered 2011-06-14: 1 g via INTRAVENOUS
  Filled 2011-06-13: qty 10

## 2011-06-13 MED ORDER — LEVALBUTEROL HCL 0.63 MG/3ML IN NEBU
0.6300 mg | INHALATION_SOLUTION | RESPIRATORY_TRACT | Status: DC | PRN
  Administered 2011-06-18: 0.63 mg via RESPIRATORY_TRACT
  Filled 2011-06-13 (×2): qty 3

## 2011-06-13 MED ORDER — POTASSIUM CHLORIDE CRYS ER 20 MEQ PO TBCR
40.0000 meq | EXTENDED_RELEASE_TABLET | Freq: Two times a day (BID) | ORAL | Status: AC
  Administered 2011-06-14 – 2011-06-15 (×4): 40 meq via ORAL
  Filled 2011-06-13 (×4): qty 2

## 2011-06-13 MED ORDER — IBUPROFEN 400 MG PO TABS
400.0000 mg | ORAL_TABLET | Freq: Four times a day (QID) | ORAL | Status: DC | PRN
  Administered 2011-06-13 – 2011-06-20 (×10): 400 mg via ORAL
  Filled 2011-06-13 (×10): qty 1

## 2011-06-13 MED ORDER — DEXTROSE 5 % IV SOLN
500.0000 mg | INTRAVENOUS | Status: DC
  Administered 2011-06-14: 500 mg via INTRAVENOUS
  Filled 2011-06-13: qty 500

## 2011-06-13 MED ORDER — MOXIFLOXACIN HCL 400 MG PO TABS: 400.0000 mg | ORAL_TABLET | Freq: Every day | ORAL | Status: DC

## 2011-06-13 NOTE — Progress Notes (Signed)
Utilization Review Completed.Sufyaan Palma T12/09/2010   

## 2011-06-13 NOTE — Progress Notes (Signed)
Subjective: Pt states he feels better. Still has low grade fevers.  Objective: Weight change:   Intake/Output Summary (Last 24 hours) at 06/13/11 0819 Last data filed at 06/13/11 0144  Gross per 24 hour  Intake    700 ml  Output      0 ml  Net    700 ml   Pt alert,has low grade temp Cardiovascular exam S1 and S2 heard. Respiratory exam : scattered rales, no wheezing heard Abdomen: soft, non tender, non distended bowel sounds are heard Extremities: no pedal edema. Cyanosis and clubbing.  Lab Results: Results for orders placed during the hospital encounter of 06/09/11 (from the past 24 hour(s))  CBC     Status: Abnormal   Collection Time   06/13/11  9:20 AM      Component Value Range   WBC 22.3 (*) 4.0 - 10.5 (K/uL)   RBC 4.03 (*) 4.22 - 5.81 (MIL/uL)   Hemoglobin 12.8 (*) 13.0 - 17.0 (g/dL)   HCT 16.1 (*) 09.6 - 52.0 (%)   MCV 88.6  78.0 - 100.0 (fL)   MCH 31.8  26.0 - 34.0 (pg)   MCHC 35.9  30.0 - 36.0 (g/dL)   RDW 04.5  40.9 - 81.1 (%)   Platelets 299  150 - 400 (K/uL)  DIFFERENTIAL     Status: Abnormal   Collection Time   06/13/11  9:20 AM      Component Value Range   Neutrophils Relative 76  43 - 77 (%)   Lymphocytes Relative 7 (*) 12 - 46 (%)   Monocytes Relative 16 (*) 3 - 12 (%)   Eosinophils Relative 0  0 - 5 (%)   Basophils Relative 1  0 - 1 (%)   Neutro Abs 16.9 (*) 1.7 - 7.7 (K/uL)   Lymphs Abs 1.6  0.7 - 4.0 (K/uL)   Monocytes Absolute 3.6 (*) 0.1 - 1.0 (K/uL)   Eosinophils Absolute 0.0  0.0 - 0.7 (K/uL)   Basophils Absolute 0.2 (*) 0.0 - 0.1 (K/uL)   WBC Morphology INCREASED BANDS (>20% BANDS)    BASIC METABOLIC PANEL     Status: Abnormal   Collection Time   06/13/11  9:20 AM      Component Value Range   Sodium 133 (*) 135 - 145 (mEq/L)   Potassium 3.2 (*) 3.5 - 5.1 (mEq/L)   Chloride 96  96 - 112 (mEq/L)   CO2 24  19 - 32 (mEq/L)   Glucose, Bld 120 (*) 70 - 99 (mg/dL)   BUN 9  6 - 23 (mg/dL)   Creatinine, Ser 9.14  0.50 - 1.35 (mg/dL)   Calcium  8.5  8.4 - 10.5 (mg/dL)   GFR calc non Af Amer >90  >90 (mL/min)   GFR calc Af Amer >90  >90 (mL/min)     Micro Results: Recent Results (from the past 240 hour(s))  CULTURE, BLOOD (ROUTINE X 2)     Status: Normal (Preliminary result)   Collection Time   06/10/11  2:12 AM      Component Value Range Status Comment   Specimen Description BLOOD RIGHT FOREARM   Final    Special Requests BOTTLES DRAWN AEROBIC AND ANAEROBIC [facility] EACH   Final    Setup Time [id number]   Final    Culture     Final    Value:        BLOOD CULTURE RECEIVED NO GROWTH TO DATE CULTURE WILL BE HELD FOR 5 DAYS BEFORE ISSUING A  FINAL NEGATIVE REPORT   Report Status PENDING   Incomplete   CULTURE, BLOOD (ROUTINE X 2)     Status: Normal (Preliminary result)   Collection Time   06/10/11  2:45 AM      Component Value Range Status Comment   Specimen Description BLOOD RIGHT ARM   Final    Special Requests BOTTLES DRAWN AEROBIC AND ANAEROBIC 10CC EACH   Final    Setup Time [id number]   Final    Culture     Final    Value:        BLOOD CULTURE RECEIVED NO GROWTH TO DATE CULTURE WILL BE HELD FOR 5 DAYS BEFORE ISSUING A FINAL NEGATIVE REPORT   Report Status PENDING   Incomplete   MRSA PCR SCREENING     Status: Normal   Collection Time   06/10/11  3:27 AM      Component Value Range Status Comment   MRSA by PCR NEGATIVE  NEGATIVE  Final     Studies/Results: Dg Chest 2 View  06/13/2011  *RADIOLOGY REPORT*  Clinical Data: Shortness of breath.  Cough.  Fever.  CHEST - 2 VIEW  Comparison: The exams back to 06/09/2011  Findings: Patchy asymmetric airspace disease seen bilaterally shows no substantial interval change since yesterday's film, but is progressed since 06/10/2011. Cardiopericardial silhouette is at upper limits of normal for size.  IMPRESSION: Patchy asymmetric airspace disease without substantial change since yesterday's film.  Original Report Authenticated By: ERIC A. MANSELL, M.D.   Dg Chest 2 View  06/09/2011   *RADIOLOGY REPORT*  Clinical Data: Cough, congestion, fever and shortness of breath.  CHEST - 2 VIEW  Comparison: None.  Findings: Multifocal infiltrates noted involving the upper and lower lung zones bilaterally.  Some of these areas are nodular in appearance and follow-up chest x-ray recommended.  There likely are tiny bilateral pleural effusions.  No edema.  Heart size and mediastinal contours are within normal limits.  IMPRESSION: Multifocal bilateral pneumonia.  Original Report Authenticated By: Reola Calkins, M.D.   Dg Chest Port 1 View  06/12/2011  *RADIOLOGY REPORT*  Clinical Data: Pneumonia.  PORTABLE CHEST - 1 VIEW  Comparison: 06/10/2011  Findings: Patchy bilateral airspace disease again noted, unchanged. There is consolidation in both lower lobes and upper lobes. Suspect trace effusions.  IMPRESSION: No significant change.  Original Report Authenticated By: Cyndie Chime, M.D.   Dg Chest Port 1 View  06/10/2011  *RADIOLOGY REPORT*  Clinical Data: Pneumonia follow up.  PORTABLE CHEST - 1 VIEW  Comparison: 06/09/2011.  Findings: Progressive bilateral patchy consolidation consistent with multifocal pneumonia possibly with associated atelectasis in the lung bases.  Recommend follow-up until complete clearance to exclude underlying mass.  No gross pneumothorax.  Central pulmonary vascular prominence.  Heart size top normal.  IMPRESSION: Progressive bilateral multifocal pneumonia suspected as noted above.  Original Report Authenticated By: Fuller Canada, M.D.   Medications: Scheduled Meds:   . acetaminophen  650 mg Oral STAT  . azithromycin  500 mg Intravenous Q24H  . cefTRIAXone (ROCEPHIN)  IV  1 g Intravenous Q24H  . enoxaparin (LOVENOX) injection  40 mg Subcutaneous Q24H  . levalbuterol  0.63 mg Nebulization BID  . oseltamivir  75 mg Oral BID  . potassium chloride  10 mEq Intravenous Q1 Hr x 4  . DISCONTD: levalbuterol  0.63 mg Nebulization Q6H   Continuous Infusions:   .  sodium chloride 150 mL/hr at 06/11/11 0901   PRN Meds:.acetaminophen, acetaminophen, alum &  mag hydroxide-simeth, ketorolac, ondansetron (ZOFRAN) IV, ondansetron  Assessment/Plan: Patient Active Hospital Problem List: Acute respiratory failure with hypoxia/ Bilateral pneumonia /SIRS (systemic inflammatory response syndrome) (06/10/2011)   On iv antibiotics day 4 of antibiotics and Tamiflu.  Fever (06/10/2011)/Leukocytosis (06/10/2011) Persistent, worsening, will call ID Consult in am. And get a CT chest with contrast.    LOS: 4 days   Nathaniel Zuniga 06/13/2011, 8:19 AM

## 2011-06-13 NOTE — Progress Notes (Signed)
HISTORY of PRESENT ILLNESS: Nathaniel Zuniga is a 35 y.o. male smoker admitted on 06/09/2011 with multi-focal pulmonary infiltrates, fever, hypoxemia.  His symptoms started 09/22.  His twin brother was recently dx with flu.  Subjective Pt indicates he feels better but still no energy.  Chest soreness continues but improved. Minimal sputum production.    CULTURES 11/30 Flu A / B>>>neg 11/30 H1N1>>>neg 11/30 MRSA PCR>>>neg 11/30 HIV>>>NR  11/30 BCx2>>>neg 11/30 UA>>>neg  ABX 11/29 Rocephin>>>12/3 11/29 Zithromax>>>12/3 11/29 Tamiflu>>> 11/30 Vanc>>12/1 12/4 Aveolox>>>  BEST PRACITICE GI: not indicated DVT: lovenox Nutrition: po   KEY EVENTS / STUDIES  Blood pressure 161/94, pulse 111, temperature 99.9 F (37.7 C), temperature source Oral, resp. rate 20, height 5\' 6"  (1.676 m), weight 162 lb 0.6 oz (73.5 kg), SpO2 97.00%.  PHYICAL EXAM:  Gen: well appearing, speaking in short sentences, no acute distress, tired.  HEENT: NCAT, PERRL, EOMi, OP clear, neck supple without masses PULM: ICoughing w/o wheeze or rhonchi. CV: RRR, no mgr, no JVD AB: BS+, soft, , no hsm Ext: warm, no edema, no clubbing, no cyanosis Derm: no rash or skin breakdown Neuro: A&Ox4, CN II-XII intact, strength 5/5 in all 4 extremities. Sitting at bedside for neb.   LABS BMET    Component Value Date/Time   NA 133* 06/13/2011 0920   K 3.2* 06/13/2011 0920   CL 96 06/13/2011 0920   CO2 24 06/13/2011 0920   GLUCOSE 120* 06/13/2011 0920   BUN 9 06/13/2011 0920   CREATININE 0.69 06/13/2011 0920   CALCIUM 8.5 06/13/2011 0920   GFRNONAA >90 06/13/2011 0920   GFRAA >90 06/13/2011 0920    CBC    Component Value Date/Time   WBC 22.3* 06/13/2011 0920   RBC 4.03* 06/13/2011 0920   HGB 12.8* 06/13/2011 0920   HCT 35.7* 06/13/2011 0920   PLT 299 06/13/2011 0920   MCV 88.6 06/13/2011 0920   MCH 31.8 06/13/2011 0920   MCHC 35.9 06/13/2011 0920   RDW 13.6 06/13/2011 0920   LYMPHSABS 1.6 06/13/2011 0920   MONOABS 3.6*  06/13/2011 0920   EOSABS 0.0 06/13/2011 0920   BASOSABS 0.2* 06/13/2011 0920    ABG    Component Value Date/Time   PHART 7.475* 06/10/2011 0111   HCO3 23.6 06/10/2011 0111   TCO2 24.6 06/10/2011 0111   O2SAT 89.6 06/10/2011 0111   Dg Chest 2 View  06/13/2011  *RADIOLOGY REPORT*  Clinical Data: Shortness of breath.  Cough.  Fever.  CHEST - 2 VIEW  Comparison: The exams back to 06/09/2011  Findings: Patchy asymmetric airspace disease seen bilaterally shows no substantial interval change since yesterday's film, but is progressed since 06/10/2011. Cardiopericardial silhouette is at upper limits of normal for size.  IMPRESSION: Patchy asymmetric airspace disease without substantial change since yesterday's film.  Original Report Authenticated By: ERIC A. MANSELL, M.D.   Dg Chest Port 1 View  06/12/2011  *RADIOLOGY REPORT*  Clinical Data: Pneumonia.  PORTABLE CHEST - 1 VIEW  Comparison: 06/10/2011  Findings: Patchy bilateral airspace disease again noted, unchanged. There is consolidation in both lower lobes and upper lobes. Suspect trace effusions.  IMPRESSION: No significant change.  Original Report Authenticated By: Cyndie Chime, M.D.      ASSESSMENT/PLAN:  B/L pulmonary infiltrates with fever -has been on broad spectrum Abx -still has fever, and has some progression of infiltrates -continue IV abx -will check ESR, ANA, RF, ANCA -check CT chest -continue Tamiflu -change nebs to prn  Hypoxemia -titrate oxygen to keep  SpO2 > 92%  HTN, proteinuria -monitor blood pressure  Hypokalemia -f/u and replace electrolytes as needed  SIRS -decrease IV fluid  Tobacco abuse -will need further education about smoking cessation  Pain -motrin prn  Elevated LFT -f/u LFT -hold tylenol    06/13/2011   Canary Brim, NP-C [facility] Pgr: [phone]  Brinn Westby 06/13/2011, 5:15 PM Pager:  [phone]

## 2011-06-14 ENCOUNTER — Inpatient Hospital Stay (HOSPITAL_COMMUNITY): Payer: Self-pay

## 2011-06-14 DIAGNOSIS — J189 Pneumonia, unspecified organism: Secondary | ICD-10-CM

## 2011-06-14 DIAGNOSIS — R509 Fever, unspecified: Secondary | ICD-10-CM

## 2011-06-14 DIAGNOSIS — J9600 Acute respiratory failure, unspecified whether with hypoxia or hypercapnia: Secondary | ICD-10-CM

## 2011-06-14 DIAGNOSIS — R0902 Hypoxemia: Secondary | ICD-10-CM

## 2011-06-14 LAB — HEPATIC FUNCTION PANEL
ALT: 372 U/L — ABNORMAL HIGH (ref 0–53)
Albumin: 2.2 g/dL — ABNORMAL LOW (ref 3.5–5.2)
Alkaline Phosphatase: 155 U/L — ABNORMAL HIGH (ref 39–117)
Total Protein: 7 g/dL (ref 6.0–8.3)

## 2011-06-14 LAB — BASIC METABOLIC PANEL
CO2: 25 mEq/L (ref 19–32)
Calcium: 8.3 mg/dL — ABNORMAL LOW (ref 8.4–10.5)
Creatinine, Ser: 0.68 mg/dL (ref 0.50–1.35)
Glucose, Bld: 105 mg/dL — ABNORMAL HIGH (ref 70–99)

## 2011-06-14 LAB — CBC
MCH: 30.8 pg (ref 26.0–34.0)
MCV: 88.6 fL (ref 78.0–100.0)
Platelets: 343 10*3/uL (ref 150–400)
RDW: 13.6 % (ref 11.5–15.5)

## 2011-06-14 LAB — AFB CULTURE WITH SMEAR (NOT AT ARMC): Acid Fast Smear: NONE SEEN

## 2011-06-14 LAB — EXPECTORATED SPUTUM ASSESSMENT W GRAM STAIN, RFLX TO RESP C

## 2011-06-14 MED ORDER — PIPERACILLIN-TAZOBACTAM 3.375 G IVPB 30 MIN: 3.3750 g | Freq: Three times a day (TID) | INTRAVENOUS | Status: DC

## 2011-06-14 MED ORDER — SODIUM CHLORIDE 0.9 % IV SOLN
3.0000 g | Freq: Four times a day (QID) | INTRAVENOUS | Status: DC
  Administered 2011-06-14 – 2011-06-16 (×6): 3 g via INTRAVENOUS
  Filled 2011-06-14 (×8): qty 3

## 2011-06-14 MED ORDER — LINEZOLID 2 MG/ML IV SOLN
600.0000 mg | Freq: Two times a day (BID) | INTRAVENOUS | Status: DC
  Administered 2011-06-14 – 2011-06-15 (×3): 600 mg via INTRAVENOUS
  Filled 2011-06-14 (×4): qty 300

## 2011-06-14 MED ORDER — SODIUM CHLORIDE 0.9 % IV SOLN
3.0000 g | Freq: Once | INTRAVENOUS | Status: DC
  Filled 2011-06-14: qty 3

## 2011-06-14 MED ORDER — PIPERACILLIN-TAZOBACTAM 3.375 G IVPB
3.3750 g | Freq: Three times a day (TID) | INTRAVENOUS | Status: DC
  Administered 2011-06-14: 3.375 g via INTRAVENOUS
  Filled 2011-06-14 (×3): qty 50

## 2011-06-14 MED ORDER — VANCOMYCIN HCL IN DEXTROSE 1-5 GM/200ML-% IV SOLN
1000.0000 mg | Freq: Three times a day (TID) | INTRAVENOUS | Status: DC
  Filled 2011-06-14 (×2): qty 200

## 2011-06-14 NOTE — Consult Note (Signed)
Date of Admission:  06/09/2011  Date of Consult:  06/14/2011  Reason for Consult Cavitary pneumonia, rule out TB Referring Physician: Dr. Blake Divine   HPI: Nathaniel Zuniga is an 35 y.o. male of [place] and African American descent whose PMHX is significant for tobacco use, marijuana use and heavy etoh use three times weekly who developed malaise and worsening of his "smokers cough" around Thanksgiving day. His twin brother with whom he was visiting at the time also became ill and was diagnosed with the flu and his twin was given tamiflu samples and rx. Apparently our patient ended up taking a few doses himself, prior to his testing negative by PCR here at [facility]. He continued to feel ill with progressive fevers, chills, malaise, and came to [facility] ED where CXR showed multifocal pneumonia, He was started on rocephin, tamiflu and azithromycin. CT scan shows a cavitary pneuonia involving mutlilple lobes. Of signicance the patient has been drinking heavy etoh three times weekly and had lost consciousness approximately 10 days before becoming ill. He endorses symptoms of dyspnea on exertion going back for a year that he attrributed to his smoking. He has not had chronic fevers, and has in fact gained weight 20# over past half year. He has grown up in [place] but has visited [address], [place], [address] and even lived in [facility] near [place] where his mother had taken th[place] to [place]. He has tested negative for TB by skin test at [facility]. I spent greater than 60 minutes with the patient including greater than 50% of time in face to face counsel of the patient and in coordination of their care.     History reviewed. No pertinent past medical history.  Past Surgical History  Procedure Date  . Broken nose repair   ergies:   No Known Allergies   Medications: I have reviewed patients current medications as documented in Epic Anti-infectives     Start     Dose/Rate Route Frequency Ordered Stop     06/15/11 0000   Ampicillin-Sulbactam (UNASYN) 3 g in sodium chloride 0.9 % 100 mL IVPB        3 g 100 mL/hr over 60 Minutes Intravenous Every 6 hours 06/14/11 1644     06/14/11 1800   moxifloxacin (AVELOX) tablet 400 mg  Status:  Discontinued        400 mg Oral Daily-1800 06/13/11 1445 06/13/11 1721   06/14/11 1730   Ampicillin-Sulbactam (UNASYN) 3 g in sodium chloride 0.9 % 100 mL IVPB        3 g 100 mL/hr over 60 Minutes Intravenous  Once 06/14/11 1644     06/14/11 1200   linezolid (ZYVOX) IVPB 600 mg        600 mg 300 mL/hr over 60 Minutes Intravenous 2 times daily 06/14/11 1042     06/14/11 1100   piperacillin-tazobactam (ZOSYN) IVPB 3.375 g  Status:  Discontinued        3.375 g 12.5 mL/hr over 240 Minutes Intravenous Every 8 hours 06/14/11 0923 06/14/11 1622   06/14/11 0930   vancomycin (VANCOCIN) IVPB 1000 mg/200 mL premix  Status:  Discontinued        1,000 mg 200 mL/hr over 60 Minutes Intravenous Every 8 hours 06/14/11 0917 06/14/11 1042   06/14/11 0930   piperacillin-tazobactam (ZOSYN) IVPB 3.375 g  Status:  Discontinued        3.375 g 100 mL/hr over 30 Minutes Intravenous Every 8 hours 06/14/11 0919  06/14/11 0921   06/14/11 0000   cefTRIAXone (ROCEPHIN) 1 g in dextrose 5 % 50 mL IVPB  Status:  Discontinued        1 g 100 mL/hr over 30 Minutes Intravenous Every 24 hours 06/13/11 1725 06/14/11 0840   06/14/11 0000   azithromycin (ZITHROMAX) 500 mg in dextrose 5 % 250 mL IVPB  Status:  Discontinued        500 mg 250 mL/hr over 60 Minutes Intravenous Every 24 hours 06/13/11 1725 06/14/11 0837   06/10/11 1430   vancomycin (VANCOCIN) IVPB 1000 mg/200 mL premix  Status:  Discontinued        1,000 mg 200 mL/hr over 60 Minutes Intravenous Every 8 hours 06/10/11 1358 06/11/11 1925   06/10/11 1000   oseltamivir (TAMIFLU) capsule 75 mg     Comments: FOR FIVE DAYS      75 mg Oral 2 times daily 06/10/11 0128     06/09/11 2115   cefTRIAXone (ROCEPHIN) 1 g in dextrose 5 % 50  mL IVPB  Status:  Discontinued        1 g 100 mL/hr over 30 Minutes Intravenous Every 24 hours 06/09/11 2110 06/13/11 1718   06/09/11 2115   azithromycin (ZITHROMAX) 500 mg in dextrose 5 % 250 mL IVPB  Status:  Discontinued        500 mg 250 mL/hr over 60 Minutes Intravenous Every 24 hours 06/09/11 2110 06/13/11 1718          Social History:  reports that he has been smoking.  He does not have any smokeless tobacco history on file. He reports that he drinks alcohol. He reports that he uses illicit drugs.  Family History  Problem Relation Age of Onset  . Coronary artery disease Other     As in HPI and primary teams notes otherwise 12 point review of systems is negative  Blood pressure 132/83, pulse 86, temperature 101 F (38.3 C), temperature source Oral, resp. rate 20, height 5\' 6"  (1.676 m), weight 162 lb 0.6 oz (73.5 kg), SpO2 96.00%. General: Alert and awake, oriented x3, not in any acute distress. HEENT: anicteric sclera, pupils reactive to light and accommodation, EOMI, oropharynx clear and without exudate CVS regular rate, normal r,  no murmur rubs or gallops Chest: few crackles at bases biltarerally, no wheezing, rales or rhonchi Abdomen: soft nontender, nondistended, normal bowel sounds, Extremities: no  clubbing or edema noted bilaterally Skin: no rashes Neuro: nonfocal, strength and sensation intact   Results for orders placed during the hospital encounter of 06/09/11 (from the past 48 hour(s))  CBC     Status: Abnormal   Collection Time   06/13/11  9:20 AM      Component Value Range Comment   WBC 22.3 (*) 4.0 - 10.5 (K/uL)    RBC 4.03 (*) 4.22 - 5.81 (MIL/uL)    Hemoglobin 12.8 (*) 13.0 - 17.0 (g/dL)    HCT 19.1 (*) 47.8 - 52.0 (%)    MCV 88.6  78.0 - 100.0 (fL)    MCH 31.8  26.0 - 34.0 (pg)    MCHC 35.9  30.0 - 36.0 (g/dL)    RDW 29.5  62.1 - 30.8 (%)    Platelets 299  150 - 400 (K/uL)   DIFFERENTIAL     Status: Abnormal   Collection Time   06/13/11  9:20  AM      Component Value Range Comment   Neutrophils Relative 76  43 - 77 (%)  Lymphocytes Relative 7 (*) 12 - 46 (%)    Monocytes Relative 16 (*) 3 - 12 (%)    Eosinophils Relative 0  0 - 5 (%)    Basophils Relative 1  0 - 1 (%)    Neutro Abs 16.9 (*) 1.7 - 7.7 (K/uL)    Lymphs Abs 1.6  0.7 - 4.0 (K/uL)    Monocytes Absolute 3.6 (*) 0.1 - 1.0 (K/uL)    Eosinophils Absolute 0.0  0.0 - 0.7 (K/uL)    Basophils Absolute 0.2 (*) 0.0 - 0.1 (K/uL)    WBC Morphology INCREASED BANDS (>20% BANDS)     BASIC METABOLIC PANEL     Status: Abnormal   Collection Time   06/13/11  9:20 AM      Component Value Range Comment   Sodium 133 (*) 135 - 145 (mEq/L)    Potassium 3.2 (*) 3.5 - 5.1 (mEq/L)    Chloride 96  96 - 112 (mEq/L)    CO2 24  19 - 32 (mEq/L)    Glucose, Bld 120 (*) 70 - 99 (mg/dL)    BUN 9  6 - 23 (mg/dL)    Creatinine, Ser 1.61  0.50 - 1.35 (mg/dL)    Calcium 8.5  8.4 - 10.5 (mg/dL)    GFR calc non Af Amer >90  >90 (mL/min)    GFR calc Af Amer >90  >90 (mL/min)   ANA     Status: Normal   Collection Time   06/13/11  7:51 PM      Component Value Range Comment   ANA NEGATIVE  NEGATIVE    RHEUMATOID FACTOR     Status: Normal   Collection Time   06/13/11  7:51 PM      Component Value Range Comment   Rheumatoid Factor 13  <=14 (IU/mL)   CYCLIC CITRUL PEPTIDE ANTIBODY, IGG     Status: Normal   Collection Time   06/13/11  7:51 PM      Component Value Range Comment   Cyclic Citrullin Peptide Ab <0.9  0.0 - 5.0 (U/mL)   SEDIMENTATION RATE     Status: Abnormal   Collection Time   06/13/11  7:51 PM      Component Value Range Comment   Sed Rate 110 (*) 0 - 16 (mm/hr)   BASIC METABOLIC PANEL     Status: Abnormal   Collection Time   06/14/11  5:40 AM      Component Value Range Comment   Sodium 134 (*) 135 - 145 (mEq/L)    Potassium 3.8  3.5 - 5.1 (mEq/L)    Chloride 97  96 - 112 (mEq/L)    CO2 25  19 - 32 (mEq/L)    Glucose, Bld 105 (*) 70 - 99 (mg/dL)    BUN 7  6 - 23 (mg/dL)     Creatinine, Ser 6.04  0.50 - 1.35 (mg/dL)    Calcium 8.3 (*) 8.4 - 10.5 (mg/dL)    GFR calc non Af Amer >90  >90 (mL/min)    GFR calc Af Amer >90  >90 (mL/min)   CBC     Status: Abnormal   Collection Time   06/14/11  5:40 AM      Component Value Range Comment   WBC 20.4 (*) 4.0 - 10.5 (K/uL)    RBC 4.03 (*) 4.22 - 5.81 (MIL/uL)    Hemoglobin 12.4 (*) 13.0 - 17.0 (g/dL)    HCT 54.0 (*) 98.1 - 52.0 (%)  MCV 88.6  78.0 - 100.0 (fL)    MCH 30.8  26.0 - 34.0 (pg)    MCHC 34.7  30.0 - 36.0 (g/dL)    RDW 16.1  09.6 - 04.5 (%)    Platelets 343  150 - 400 (K/uL)   HEPATIC FUNCTION PANEL     Status: Abnormal   Collection Time   06/14/11  5:40 AM      Component Value Range Comment   Total Protein 7.0  6.0 - 8.3 (g/dL)    Albumin 2.2 (*) 3.5 - 5.2 (g/dL)    AST 409 (*) 0 - 37 (U/L)    ALT 372 (*) 0 - 53 (U/L)    Alkaline Phosphatase 155 (*) 39 - 117 (U/L)    Total Bilirubin 0.4  0.3 - 1.2 (mg/dL)    Bilirubin, Direct 0.1  0.0 - 0.3 (mg/dL)    Indirect Bilirubin 0.3  0.3 - 0.9 (mg/dL)   CULTURE, SPUTUM-ASSESSMENT     Status: Normal   Collection Time   06/14/11 12:33 PM      Component Value Range Comment   Specimen Description SPUTUM      Special Requests NONE      Sputum evaluation        Value: THIS SPECIMEN IS ACCEPTABLE. RESPIRATORY CULTURE REPORT TO FOLLOW.   Report Status 06/14/2011 FINAL         Component Value Date/Time   SDES SPUTUM 06/14/2011 1233   SPECREQUEST NONE 06/14/2011 1233   CULT        BLOOD CULTURE RECEIVED NO GROWTH TO DATE CULTURE WILL BE HELD FOR 5 DAYS BEFORE ISSUING A FINAL NEGATIVE REPORT 06/12/2011 1700   REPTSTATUS 06/14/2011 FINAL 06/14/2011 1233   Dg Chest 2 View  06/13/2011  *RADIOLOGY REPORT*  Clinical Data: Shortness of breath.  Cough.  Fever.  CHEST - 2 VIEW  Comparison: The exams back to 06/09/2011  Findings: Patchy asymmetric airspace disease seen bilaterally shows no substantial interval change since yesterday's film, but is progressed since  06/10/2011. Cardiopericardial silhouette is at upper limits of normal for size.  IMPRESSION: Patchy asymmetric airspace disease without substantial change since yesterday's film.  Original Report Authenticated By: ERIC A. MANSELL, M.D.   Ct Chest W Contrast  06/13/2011  *RADIOLOGY REPORT*  Clinical Data: Cough, fever, pneumonia.  Progressive lung infiltrates.  CT CHEST WITH CONTRAST  Technique:  Multidetector CT imaging of the chest was performed following the standard protocol during bolus administration of intravenous contrast.  Contrast:  80 ml Omnipaque-300  Comparison: Chest radiographs obtained earlier today.  Findings: Patchy, confluent and mass-like areas of increased density scattered throughout both lungs.  Some of these areas are developing central cavitation.  These are involving all of the lobes in both lungs.  Mildly enlarged bilateral hilar lymph nodes.  These include a 1.5 x 1.0 cm left hilar lymph node on image number 32 and a 0.9 x 0.7 cm right hilar lymph node on image number 31.  Small left pleural effusion and minimal right pleural effusion. Unremarkable upper abdomen.  IMPRESSION:  1.  Extensive bilateral cavitary pneumonia. Differential considerations include Staphylococcus, Klebsiella and fungal pneumonia.  This is not a typical distribution for tuberculosis, but this needs to be considered. 2.  Mild bilateral hilar adenopathy.  Original Report Authenticated By: Darrol Angel, M.D.   US Abdomen Complete  06/14/2011  *RADIOLOGY REPORT*  Clinical Data:  Increased LFTs  ABDOMINAL ULTRASOUND COMPLETE  Comparison:  Chest CT 06/13/2011  Findings:  Gallbladder:  No gallstones, gallbladder wall thickening, or pericholecystic fluid. Sonographic Murphy's sign is negative.  Common Bile Duct:  Within normal limits in caliber.  Liver: No focal mass lesion identified.  Within normal limits in parenchymal echogenicity.  IVC:  Appears normal.  Pancreas:  No abnormality identified.  Spleen:  Within  normal limits in size and echotexture.  Right kidney:  Normal in size and parenchymal echogenicity.  No evidence of mass or hydronephrosis.  Left kidney:  Normal in size and parenchymal echogenicity.  No evidence of mass or hydronephrosis.  Abdominal Aorta:  No aneurysm identified.  A left pleural effusion is noted.  IMPRESSION:  1.  Negative abdominal ultrasound. 2.  Left pleural effusion visualized.  Original Report Authenticated By: Britta Mccreedy, M.D.     Recent Results (from the past 720 hour(s))  CULTURE, BLOOD (ROUTINE X 2)     Status: Normal (Preliminary result)   Collection Time   06/10/11  2:12 AM      Component Value Range Status Comment   Specimen Description BLOOD RIGHT FOREARM   Final    Special Requests BOTTLES DRAWN AEROBIC AND ANAEROBIC 5CC EACH   Final    Setup Time [id number]   Final    Culture     Final    Value:        BLOOD CULTURE RECEIVED NO GROWTH TO DATE CULTURE WILL BE HELD FOR 5 DAYS BEFORE ISSUING A FINAL NEGATIVE REPORT   Report Status PENDING   Incomplete   CULTURE, BLOOD (ROUTINE X 2)     Status: Normal (Preliminary result)   Collection Time   06/10/11  2:45 AM      Component Value Range Status Comment   Specimen Description BLOOD RIGHT ARM   Final    Special Requests BOTTLES DRAWN AEROBIC AND ANAEROBIC 10CC EACH   Final    Setup Time [id number]   Final    Culture     Final    Value:        BLOOD CULTURE RECEIVED NO GROWTH TO DATE CULTURE WILL BE HELD FOR 5 DAYS BEFORE ISSUING A FINAL NEGATIVE REPORT   Report Status PENDING   Incomplete   MRSA PCR SCREENING     Status: Normal   Collection Time   06/10/11  3:27 AM      Component Value Range Status Comment   MRSA by PCR NEGATIVE  NEGATIVE  Final   CULTURE, BLOOD (ROUTINE X 2)     Status: Normal (Preliminary result)   Collection Time   06/12/11  4:50 PM      Component Value Range Status Comment   Specimen Description BLOOD LEFT ARM   Final    Special Requests BOTTLES DRAWN AEROBIC AND ANAEROBIC 10CC    Final    Setup Time [id number]   Final    Culture     Final    Value:        BLOOD CULTURE RECEIVED NO GROWTH TO DATE CULTURE WILL BE HELD FOR 5 DAYS BEFORE ISSUING A FINAL NEGATIVE REPORT   Report Status PENDING   Incomplete   CULTURE, BLOOD (ROUTINE X 2)     Status: Normal (Preliminary result)   Collection Time   06/12/11  5:00 PM      Component Value Range Status Comment   Specimen Description BLOOD LEFT WRIST   Final    Special Requests BOTTLES DRAWN AEROBIC AND ANAEROBIC 10CC   Final    Setup Time [id number]  Final    Culture     Final    Value:        BLOOD CULTURE RECEIVED NO GROWTH TO DATE CULTURE WILL BE HELD FOR 5 DAYS BEFORE ISSUING A FINAL NEGATIVE REPORT   Report Status PENDING   Incomplete   CULTURE, SPUTUM-ASSESSMENT     Status: Normal   Collection Time   06/14/11 12:33 PM      Component Value Range Status Comment   Specimen Description SPUTUM   Final    Special Requests NONE   Final    Sputum evaluation     Final    Value: THIS SPECIMEN IS ACCEPTABLE. RESPIRATORY CULTURE REPORT TO FOLLOW.   Report Status 06/14/2011 FINAL   Final      Impression/Recommendation 35 year old with hx of tobacco, marijuana use and heavy etoh use who has a multifocal cavitary pneumonia that has not responded to typical CAP coverage   Cavitary PNeumonia:  Differential includes MRSA/MSSA pneumonia in context of influenza infection or post influenza. Certainly an progressive aspiration pneumonia that could have festered after his losing consciousness is possible. Finally we need to consider possibility of TB and of fungal infection. Interestingly [place] and African Americans have increase risk for infection with coccidioides inmitus and this patient has exposure hx with hx of living [place]  --CCM have changed pt to zyvox which is reasonable for now --will place him on unasyn as well to give him good anerobic coverage --dc the azithromycin to increase yield  on AFB cultures --collect AFB cultures x2 if not more --check quantiferon gold --if he is still febrile and not responding to above mentioned antibiotics then he needs bronchoscopy for BAL for AFB, fungal organisms, quantitative cultures, nocardia   IP: --continue airborne precautions, if we rule him out for TB then we should downgrade to droplet precatuions for flu given possibilyt that his flu pcr could have been falsey negative     Thank you so much for this interesting consult,   Nathaniel Zuniga 06/14/2011, 6:14 PM   [phone] (pager) [phone] (office)

## 2011-06-14 NOTE — Progress Notes (Signed)
Subjective:    Interval History:    35 year old gentleman who was admitted initially on November 30 year by [facility] for cough back pain and shoulder pain. Chest x-ray done in the ED showed multifocal pneumonia. She was started on antibiotics coverage for community-acquired pneumonia and Tamiflu even though his influenza serology was negative. A few hours after admission patient became hypoxic the PaO2 was less than 60% he was transferred to [facility] M. service/ICU  for closer monitoring. Patient was transferred to tried service on her December 2 for further management of community-acquired pneumonia. Over the last 2 days patient continued to spike fevers, or worsening leukocytosis repeat chest x-ray did not show any change in his pulmonary infiltrates. CT chest contrast was obtained on the December 3 showed cavitary pneumonia, with hilar adenopathy possibly staph pneumonia/ less likely tuberculosis. His antibiotics were changed to vancomycin and Zosyn. Was also found to have elevated liver function tests. Subsequently infectious disease consult was obtained for management of antibiotics. Patient was put on airborne isolation and sputum sent for AFB smears and cultures. Currently his blood cultures are negative to date and his influenza serology is negative. HIV serology is negative MRSA PCR negative urine analysis was negative.   Patient states he feels better. Continues to cough. Nice any nausea vomiting diarrhea or abdominal pain.  Objective: Weight change:   Intake/Output Summary (Last 24 hours) at 06/14/11 1320 Last data filed at 06/14/11 0700  Gross per 24 hour  Intake 1141.9 ml  Output      0 ml  Net 1141.9 ml   Pt alert, afebrile oriented x3 comfortable sitting in the bed. Cardiovascular exam S1 and S2 heard.  Respiratory exam : scattered rales, no wheezing heard  Abdomen: soft, non tender, non distended bowel sounds are heard  Extremities: no pedal edema. Cyanosis and  clubbing. Neurological exam patient is alert oriented x3 ,  nonfocal   Lab Results: Results for orders placed during the hospital encounter of 06/09/11 (from the past 24 hour(s))  ANA     Status: Normal   Collection Time   06/13/11  7:51 PM      Component Value Range   ANA NEGATIVE  NEGATIVE   RHEUMATOID FACTOR     Status: Normal   Collection Time   06/13/11  7:51 PM      Component Value Range   Rheumatoid Factor 13  <=14 (IU/mL)  CYCLIC CITRUL PEPTIDE ANTIBODY, IGG     Status: Normal   Collection Time   06/13/11  7:51 PM      Component Value Range   Cyclic Citrullin Peptide Ab <1.6  0.0 - 5.0 (U/mL)  SEDIMENTATION RATE     Status: Abnormal   Collection Time   06/13/11  7:51 PM      Component Value Range   Sed Rate 110 (*) 0 - 16 (mm/hr)  BASIC METABOLIC PANEL     Status: Abnormal   Collection Time   06/14/11  5:40 AM      Component Value Range   Sodium 134 (*) 135 - 145 (mEq/L)   Potassium 3.8  3.5 - 5.1 (mEq/L)   Chloride 97  96 - 112 (mEq/L)   CO2 25  19 - 32 (mEq/L)   Glucose, Bld 105 (*) 70 - 99 (mg/dL)   BUN 7  6 - 23 (mg/dL)   Creatinine, Ser 1.09  0.50 - 1.35 (mg/dL)   Calcium 8.3 (*) 8.4 - 10.5 (mg/dL)   GFR calc non Af Amer >  90  >90 (mL/min)   GFR calc Af Amer >90  >90 (mL/min)  CBC     Status: Abnormal   Collection Time   06/14/11  5:40 AM      Component Value Range   WBC 20.4 (*) 4.0 - 10.5 (K/uL)   RBC 4.03 (*) 4.22 - 5.81 (MIL/uL)   Hemoglobin 12.4 (*) 13.0 - 17.0 (g/dL)   HCT 16.1 (*) 09.6 - 52.0 (%)   MCV 88.6  78.0 - 100.0 (fL)   MCH 30.8  26.0 - 34.0 (pg)   MCHC 34.7  30.0 - 36.0 (g/dL)   RDW 04.5  40.9 - 81.1 (%)   Platelets 343  150 - 400 (K/uL)  HEPATIC FUNCTION PANEL     Status: Abnormal   Collection Time   06/14/11  5:40 AM      Component Value Range   Total Protein 7.0  6.0 - 8.3 (g/dL)   Albumin 2.2 (*) 3.5 - 5.2 (g/dL)   AST 914 (*) 0 - 37 (U/L)   ALT 372 (*) 0 - 53 (U/L)   Alkaline Phosphatase 155 (*) 39 - 117 (U/L)   Total Bilirubin  0.4  0.3 - 1.2 (mg/dL)   Bilirubin, Direct 0.1  0.0 - 0.3 (mg/dL)   Indirect Bilirubin 0.3  0.3 - 0.9 (mg/dL)     Micro Results: Recent Results (from the past 240 hour(s))  CULTURE, BLOOD (ROUTINE X 2)     Status: Normal (Preliminary result)   Collection Time   06/10/11  2:12 AM      Component Value Range Status Comment   Specimen Description BLOOD RIGHT FOREARM   Final    Special Requests BOTTLES DRAWN AEROBIC AND ANAEROBIC 5CC EACH   Final    Setup Time [id number]   Final    Culture     Final    Value:        BLOOD CULTURE RECEIVED NO GROWTH TO DATE CULTURE WILL BE HELD FOR 5 DAYS BEFORE ISSUING A FINAL NEGATIVE REPORT   Report Status PENDING   Incomplete   CULTURE, BLOOD (ROUTINE X 2)     Status: Normal (Preliminary result)   Collection Time   06/10/11  2:45 AM      Component Value Range Status Comment   Specimen Description BLOOD RIGHT ARM   Final    Special Requests BOTTLES DRAWN AEROBIC AND ANAEROBIC 10CC EACH   Final    Setup Time [id number]   Final    Culture     Final    Value:        BLOOD CULTURE RECEIVED NO GROWTH TO DATE CULTURE WILL BE HELD FOR 5 DAYS BEFORE ISSUING A FINAL NEGATIVE REPORT   Report Status PENDING   Incomplete   MRSA PCR SCREENING     Status: Normal   Collection Time   06/10/11  3:27 AM      Component Value Range Status Comment   MRSA by PCR NEGATIVE  NEGATIVE  Final   CULTURE, BLOOD (ROUTINE X 2)     Status: Normal (Preliminary result)   Collection Time   06/12/11  4:50 PM      Component Value Range Status Comment   Specimen Description BLOOD LEFT ARM   Final    Special Requests BOTTLES DRAWN AEROBIC AND ANAEROBIC 10CC   Final    Setup Time [id number]   Final    Culture     Final    Value:  BLOOD CULTURE RECEIVED NO GROWTH TO DATE CULTURE WILL BE HELD FOR 5 DAYS BEFORE ISSUING A FINAL NEGATIVE REPORT   Report Status PENDING   Incomplete   CULTURE, BLOOD (ROUTINE X 2)     Status: Normal (Preliminary result)   Collection Time    06/12/11  5:00 PM      Component Value Range Status Comment   Specimen Description BLOOD LEFT WRIST   Final    Special Requests BOTTLES DRAWN AEROBIC AND ANAEROBIC 10CC   Final    Setup Time [id number]   Final    Culture     Final    Value:        BLOOD CULTURE RECEIVED NO GROWTH TO DATE CULTURE WILL BE HELD FOR 5 DAYS BEFORE ISSUING A FINAL NEGATIVE REPORT   Report Status PENDING   Incomplete     Studies/Results: Dg Chest 2 View  06/13/2011  *RADIOLOGY REPORT*  Clinical Data: Shortness of breath.  Cough.  Fever.  CHEST - 2 VIEW  Comparison: The exams back to 06/09/2011  Findings: Patchy asymmetric airspace disease seen bilaterally shows no substantial interval change since yesterday's film, but is progressed since 06/10/2011. Cardiopericardial silhouette is at upper limits of normal for size.  IMPRESSION: Patchy asymmetric airspace disease without substantial change since yesterday's film.  Original Report Authenticated By: ERIC A. MANSELL, M.D.   Dg Chest 2 View  06/09/2011  *RADIOLOGY REPORT*  Clinical Data: Cough, congestion, fever and shortness of breath.  CHEST - 2 VIEW  Comparison: None.  Findings: Multifocal infiltrates noted involving the upper and lower lung zones bilaterally.  Some of these areas are nodular in appearance and follow-up chest x-ray recommended.  There likely are tiny bilateral pleural effusions.  No edema.  Heart size and mediastinal contours are within normal limits.  IMPRESSION: Multifocal bilateral pneumonia.  Original Report Authenticated By: Reola Calkins, M.D.   Ct Chest W Contrast  06/13/2011  *RADIOLOGY REPORT*  Clinical Data: Cough, fever, pneumonia.  Progressive lung infiltrates.  CT CHEST WITH CONTRAST  Technique:  Multidetector CT imaging of the chest was performed following the standard protocol during bolus administration of intravenous contrast.  Contrast:  80 ml Omnipaque-300  Comparison: Chest radiographs obtained earlier today.  Findings: Patchy,  confluent and mass-like areas of increased density scattered throughout both lungs.  Some of these areas are developing central cavitation.  These are involving all of the lobes in both lungs.  Mildly enlarged bilateral hilar lymph nodes.  These include a 1.5 x 1.0 cm left hilar lymph node on image number 32 and a 0.9 x 0.7 cm right hilar lymph node on image number 31.  Small left pleural effusion and minimal right pleural effusion. Unremarkable upper abdomen.  IMPRESSION:  1.  Extensive bilateral cavitary pneumonia. Differential considerations include Staphylococcus, Klebsiella and fungal pneumonia.  This is not a typical distribution for tuberculosis, but this needs to be considered. 2.  Mild bilateral hilar adenopathy.  Original Report Authenticated By: Darrol Angel, M.D.   Dg Chest Port 1 View  06/12/2011  *RADIOLOGY REPORT*  Clinical Data: Pneumonia.  PORTABLE CHEST - 1 VIEW  Comparison: 06/10/2011  Findings: Patchy bilateral airspace disease again noted, unchanged. There is consolidation in both lower lobes and upper lobes. Suspect trace effusions.  IMPRESSION: No significant change.  Original Report Authenticated By: Cyndie Chime, M.D.   Dg Chest Port 1 View  06/10/2011  *RADIOLOGY REPORT*  Clinical Data: Pneumonia follow up.  PORTABLE CHEST - 1  VIEW  Comparison: 06/09/2011.  Findings: Progressive bilateral patchy consolidation consistent with multifocal pneumonia possibly with associated atelectasis in the lung bases.  Recommend follow-up until complete clearance to exclude underlying mass.  No gross pneumothorax.  Central pulmonary vascular prominence.  Heart size top normal.  IMPRESSION: Progressive bilateral multifocal pneumonia suspected as noted above.  Original Report Authenticated By: Fuller Canada, M.D.   Medications: Scheduled Meds:   . enoxaparin (LOVENOX) injection  40 mg Subcutaneous Q24H  . linezolid  600 mg Intravenous BID  . oseltamivir  75 mg Oral BID  .  piperacillin-tazobactam  3.375 g Intravenous Q8H  . potassium chloride  40 mEq Oral BID  . DISCONTD: azithromycin  500 mg Intravenous Q24H  . DISCONTD: azithromycin  500 mg Intravenous Q24H  . DISCONTD: cefTRIAXone (ROCEPHIN)  IV  1 g Intravenous Q24H  . DISCONTD: cefTRIAXone (ROCEPHIN)  IV  1 g Intravenous Q24H  . DISCONTD: levalbuterol  0.63 mg Nebulization BID  . DISCONTD: moxifloxacin  400 mg Oral q1800  . DISCONTD: piperacillin-tazobactam  3.375 g Intravenous Q8H  . DISCONTD: vancomycin  1,000 mg Intravenous Q8H   Continuous Infusions:   . DISCONTD: sodium chloride 150 mL/hr at 06/11/11 0901   PRN Meds:.alum & mag hydroxide-simeth, ibuprofen, levalbuterol, ondansetron (ZOFRAN) IV, ondansetron, DISCONTD: acetaminophen, DISCONTD: acetaminophen, DISCONTD: ketorolac  Assessment/Plan:   Acute hypoxic respiratory failure/bilateral cavitary pneumonia:   Day 5 of IV antibiotics, currently patient is on Zosyn and vancomycin. We'll continue Tamiflu for 5 days. Nasal oxygen as needed, Nebulizations as needed. The patient's blood cultures negative up so for, influenza serology negative, urinalysis negative. PCCM and infectious disease on board  Elevated liver function tests : secondary to disease process. Hepatitis serologies pending ultrasound of the abdomen is pending we'll repeat LFTs in the morning.   Tobacco abuse tobacco cessation counseling given.  Persistent leukocytosis secondary to resolving pneumonia.   LOS: 5 days   Korrina Zern 06/14/2011, 1:20 PM

## 2011-06-14 NOTE — Progress Notes (Signed)
PHARMACY - ANTIBIOTIC CONSULT FOLLOW-UP NOTE  Pharmacy Consult for: Unasyn  Indication: rule out pneumonia   Patient Data:   Allergies: No Known Allergies  Patient Measurements: Height: 5\' 6"  (167.6 cm) Weight: 162 lb 0.6 oz (73.5 kg) IBW/kg (Calculated) : 63.8  Adjusted Body Weight:   Vital Signs: Temp:  [97.7 F (36.5 C)-101 F (38.3 C)] 101 F (38.3 C) (12/04 1357) Pulse Rate:  [86-89] 86  (12/04 0545) Resp:  [20] 20  (12/04 0545) BP: (132-144)/(83-98) 132/83 mmHg (12/04 0545) SpO2:  [96 %-98 %] 96 % (12/04 0545)  Intake/Output from previous day:  Intake/Output Summary (Last 24 hours) at 06/14/11 1638 Last data filed at 06/14/11 0700  Gross per 24 hour  Intake 1064.03 ml  Output      0 ml  Net 1064.03 ml    Labs:  Basename 06/14/11 0540 06/13/11 0920 06/12/11 1650  WBC 20.4* 22.3* 20.2*  HGB 12.4* 12.8* 13.2  PLT 343 299 262  LABCREA -- -- --  CREATININE 0.68 0.69 0.75   Estimated Creatinine Clearance: 117.4 ml/min (by C-G formula based on Cr of 0.68). No results found for this basename: VANCOTROUGH:2,VANCOPEAK:2,VANCORANDOM:2,GENTTROUGH:2,GENTPEAK:2,GENTRANDOM:2,TOBRATROUGH:2,TOBRAPEAK:2,TOBRARND:2,AMIKACINPEAK:2,AMIKACINTROU:2,AMIKACIN:2, in the last 72 hours   Microbiology: Recent Results (from the past 720 hour(s))  CULTURE, BLOOD (ROUTINE X 2)     Status: Normal (Preliminary result)   Collection Time   06/10/11  2:12 AM      Component Value Range Status Comment   Specimen Description BLOOD RIGHT FOREARM   Final    Special Requests BOTTLES DRAWN AEROBIC AND ANAEROBIC 5CC EACH   Final    Setup Time [id number]   Final    Culture     Final    Value:        BLOOD CULTURE RECEIVED NO GROWTH TO DATE CULTURE WILL BE HELD FOR 5 DAYS BEFORE ISSUING A FINAL NEGATIVE REPORT   Report Status PENDING   Incomplete   CULTURE, BLOOD (ROUTINE X 2)     Status: Normal (Preliminary result)   Collection Time   06/10/11  2:45 AM      Component Value Range Status  Comment   Specimen Description BLOOD RIGHT ARM   Final    Special Requests BOTTLES DRAWN AEROBIC AND ANAEROBIC 10CC EACH   Final    Setup Time [id number]   Final    Culture     Final    Value:        BLOOD CULTURE RECEIVED NO GROWTH TO DATE CULTURE WILL BE HELD FOR 5 DAYS BEFORE ISSUING A FINAL NEGATIVE REPORT   Report Status PENDING   Incomplete   MRSA PCR SCREENING     Status: Normal   Collection Time   06/10/11  3:27 AM      Component Value Range Status Comment   MRSA by PCR NEGATIVE  NEGATIVE  Final   CULTURE, BLOOD (ROUTINE X 2)     Status: Normal (Preliminary result)   Collection Time   06/12/11  4:50 PM      Component Value Range Status Comment   Specimen Description BLOOD LEFT ARM   Final    Special Requests BOTTLES DRAWN AEROBIC AND ANAEROBIC 10CC   Final    Setup Time [id number]   Final    Culture     Final    Value:        BLOOD CULTURE RECEIVED NO GROWTH TO DATE CULTURE WILL BE HELD FOR 5 DAYS BEFORE ISSUING A FINAL NEGATIVE REPORT   Report  Status PENDING   Incomplete   CULTURE, BLOOD (ROUTINE X 2)     Status: Normal (Preliminary result)   Collection Time   06/12/11  5:00 PM      Component Value Range Status Comment   Specimen Description BLOOD LEFT WRIST   Final    Special Requests BOTTLES DRAWN AEROBIC AND ANAEROBIC 10CC   Final    Setup Time [id number]   Final    Culture     Final    Value:        BLOOD CULTURE RECEIVED NO GROWTH TO DATE CULTURE WILL BE HELD FOR 5 DAYS BEFORE ISSUING A FINAL NEGATIVE REPORT   Report Status PENDING   Incomplete   CULTURE, SPUTUM-ASSESSMENT     Status: Normal   Collection Time   06/14/11 12:33 PM      Component Value Range Status Comment   Specimen Description SPUTUM   Final    Special Requests NONE   Final    Sputum evaluation     Final    Value: THIS SPECIMEN IS ACCEPTABLE. RESPIRATORY CULTURE REPORT TO FOLLOW.   Report Status 06/14/2011 FINAL   Final     Anti-infectives     Start     Dose/Rate Route Frequency Ordered  Stop   06/14/11 1800   moxifloxacin (AVELOX) tablet 400 mg  Status:  Discontinued        400 mg Oral Daily-1800 06/13/11 1445 06/13/11 1721   06/14/11 1200   linezolid (ZYVOX) IVPB 600 mg        600 mg 300 mL/hr over 60 Minutes Intravenous 2 times daily 06/14/11 1042     06/14/11 1100   piperacillin-tazobactam (ZOSYN) IVPB 3.375 g  Status:  Discontinued        3.375 g 12.5 mL/hr over 240 Minutes Intravenous Every 8 hours 06/14/11 0923 06/14/11 1622   06/14/11 0930   vancomycin (VANCOCIN) IVPB 1000 mg/200 mL premix  Status:  Discontinued        1,000 mg 200 mL/hr over 60 Minutes Intravenous Every 8 hours 06/14/11 0917 06/14/11 1042   06/14/11 0930   piperacillin-tazobactam (ZOSYN) IVPB 3.375 g  Status:  Discontinued        3.375 g 100 mL/hr over 30 Minutes Intravenous Every 8 hours 06/14/11 0919 06/14/11 0921   06/14/11 0000   cefTRIAXone (ROCEPHIN) 1 g in dextrose 5 % 50 mL IVPB  Status:  Discontinued        1 g 100 mL/hr over 30 Minutes Intravenous Every 24 hours 06/13/11 1725 06/14/11 0840   06/14/11 0000   azithromycin (ZITHROMAX) 500 mg in dextrose 5 % 250 mL IVPB  Status:  Discontinued        500 mg 250 mL/hr over 60 Minutes Intravenous Every 24 hours 06/13/11 1725 06/14/11 0837   06/10/11 1430   vancomycin (VANCOCIN) IVPB 1000 mg/200 mL premix  Status:  Discontinued        1,000 mg 200 mL/hr over 60 Minutes Intravenous Every 8 hours 06/10/11 1358 06/11/11 1925   06/10/11 1000   oseltamivir (TAMIFLU) capsule 75 mg     Comments: FOR FIVE DAYS      75 mg Oral 2 times daily 06/10/11 0128     06/09/11 2115   cefTRIAXone (ROCEPHIN) 1 g in dextrose 5 % 50 mL IVPB  Status:  Discontinued        1 g 100 mL/hr over 30 Minutes Intravenous Every 24 hours 06/09/11 2110 06/13/11 1718   06/09/11  2115   azithromycin (ZITHROMAX) 500 mg in dextrose 5 % 250 mL IVPB  Status:  Discontinued        500 mg 250 mL/hr over 60 Minutes Intravenous Every 24 hours 06/09/11 2110 06/13/11 1718             Assessment:  35 y.o. male admitted on 06/09/2011, is being treated for PNA with Unasyn. Pharmacy consulted to dose Unasyn. Patient is also to receive linezolid.  Plan:  1. Unasyn 3gm IV Q6H. 2.  As renal function has been stable, do not anticipate any dose adjustments will be required. Pharmacy will sign-off. Thank you for the consult.   Dineen Kid Thad Ranger, PharmD 06/14/2011, 4:38 PM

## 2011-06-14 NOTE — Consult Note (Signed)
ANTIBIOTIC CONSULT NOTE - INITIAL  Pharmacy Consult for Vancomycin, Zosyn Indication: rule out pneumonia  No Known Allergies  Patient Measurements: Height: 5\' 6"  (167.6 cm) Weight: 162 lb 0.6 oz (73.5 kg) IBW/kg (Calculated) : 63.8    Vital Signs: Temp: 97.7 F (36.5 C) (12/04 0545) Temp src: Oral (12/04 0545) BP: 132/83 mmHg (12/04 0545) Pulse Rate: 86  (12/04 0545) Tmax: 101.7 Intake/Output from previous day: 12/03 0701 - 12/04 0700 In: 1381.9 [P.O.:840; I.V.:541.9] Out: -  Intake/Output from this shift:    Labs:  Basename 06/14/11 0540 06/13/11 0920 06/12/11 1650  WBC 20.4* 22.3* 20.2*  HGB 12.4* 12.8* 13.2  PLT 343 299 262  LABCREA -- -- --  CREATININE 0.68 0.69 0.75   Estimated Creatinine Clearance: 117.4 ml/min (by C-G formula based on Cr of 0.68).    Microbiology: Recent Results (from the past 720 hour(s))  CULTURE, BLOOD (ROUTINE X 2)     Status: Normal (Preliminary result)   Collection Time   06/10/11  2:12 AM      Component Value Range Status Comment   Specimen Description BLOOD RIGHT FOREARM   Final    Special Requests BOTTLES DRAWN AEROBIC AND ANAEROBIC 5CC EACH   Final    Setup Time [id number]   Final    Culture     Final    Value:        BLOOD CULTURE RECEIVED NO GROWTH TO DATE CULTURE WILL BE HELD FOR 5 DAYS BEFORE ISSUING A FINAL NEGATIVE REPORT   Report Status PENDING   Incomplete   CULTURE, BLOOD (ROUTINE X 2)     Status: Normal (Preliminary result)   Collection Time   06/10/11  2:45 AM      Component Value Range Status Comment   Specimen Description BLOOD RIGHT ARM   Final    Special Requests BOTTLES DRAWN AEROBIC AND ANAEROBIC 10CC EACH   Final    Setup Time [id number]   Final    Culture     Final    Value:        BLOOD CULTURE RECEIVED NO GROWTH TO DATE CULTURE WILL BE HELD FOR 5 DAYS BEFORE ISSUING A FINAL NEGATIVE REPORT   Report Status PENDING   Incomplete   MRSA PCR SCREENING     Status: Normal   Collection Time   06/10/11   3:27 AM      Component Value Range Status Comment   MRSA by PCR NEGATIVE  NEGATIVE  Final   CULTURE, BLOOD (ROUTINE X 2)     Status: Normal (Preliminary result)   Collection Time   06/12/11  4:50 PM      Component Value Range Status Comment   Specimen Description BLOOD LEFT ARM   Final    Special Requests BOTTLES DRAWN AEROBIC AND ANAEROBIC 10CC   Final    Setup Time [id number]   Final    Culture     Final    Value:        BLOOD CULTURE RECEIVED NO GROWTH TO DATE CULTURE WILL BE HELD FOR 5 DAYS BEFORE ISSUING A FINAL NEGATIVE REPORT   Report Status PENDING   Incomplete   CULTURE, BLOOD (ROUTINE X 2)     Status: Normal (Preliminary result)   Collection Time   06/12/11  5:00 PM      Component Value Range Status Comment   Specimen Description BLOOD LEFT WRIST   Final    Special Requests BOTTLES DRAWN AEROBIC AND ANAEROBIC 10CC  Final    Setup Time [id number]   Final    Culture     Final    Value:        BLOOD CULTURE RECEIVED NO GROWTH TO DATE CULTURE WILL BE HELD FOR 5 DAYS BEFORE ISSUING A FINAL NEGATIVE REPORT   Report Status PENDING   Incomplete     Medical History: History reviewed. No pertinent past medical history.  Medications:  Scheduled:    . enoxaparin (LOVENOX) injection  40 mg Subcutaneous Q24H  . oseltamivir  75 mg Oral BID  . potassium chloride  40 mEq Oral BID  . DISCONTD: azithromycin  500 mg Intravenous Q24H  . DISCONTD: azithromycin  500 mg Intravenous Q24H  . DISCONTD: cefTRIAXone (ROCEPHIN)  IV  1 g Intravenous Q24H  . DISCONTD: cefTRIAXone (ROCEPHIN)  IV  1 g Intravenous Q24H  . DISCONTD: levalbuterol  0.63 mg Nebulization BID  . DISCONTD: moxifloxacin  400 mg Oral q1800   Infusions:    . sodium chloride 150 mL/hr at 06/11/11 0901   PRN: alum & mag hydroxide-simeth, ibuprofen, levalbuterol, ondansetron (ZOFRAN) IV, ondansetron, DISCONTD: acetaminophen, DISCONTD: acetaminophen, DISCONTD: ketorolac Assessment: Nathaniel Zuniga is a 35 y.o. male smoker  admitted on 06/09/2011 with multi-focal pulmonary infiltrates, fever, hypoxemia. His symptoms started 09/22. His twin brother was recently dx with flu.  Tmax overnight 101.7, WBC 20.4.   Goal of Therapy:  Vancomycin trough level 15-20 mcg/ml Zosyn to broaden coverage.  Plan:  Vancomycin 1gm Iv q 8 hours. Zosyn 3.375gm IV q 8 hours. Follow-up Vancomycin levels as indicated. Follow-up Cultures/ss.  Chetara Kropp, Elisha Headland, Pharm.D. 06/14/2011 9:06 AM

## 2011-06-14 NOTE — Progress Notes (Signed)
HISTORY of PRESENT ILLNESS: Nathaniel Zuniga is a 35 y.o. male smoker admitted on 06/09/2011 with multi-focal pulmonary infiltrates, fever, hypoxemia.  His symptoms started 11/22.  His twin brother was recently dx with flu.  Found to have cavitary b/l infiltrates on CT chest.  Cultures: 11/30 Flu A / B>>>negative 11/30 H1N1>>>negative 11/30 MRSA PCR>>>negative 11/30 HIV>>>Nonreactive 11/30 BCx2>>>negative 11/30 UA>>>negative 12/02 BCx2>>  Abx: 11/29 Rocephin>>>12/03 11/29 Zithromax>>>12/03 11/29 Tamiflu>>> 11/30 Vanc>>12/1 12/03 Zosyn>> 12/03 Vancomycin>>12/04 12/04 Linezolid>>  Tests: 12/03 CT chest>>patchy, confluent mass like densities b/l with central cavitation, Lt hilar node 1.5 x 1 cm, Rt hilar node 0.9 x 0.7 cm, small b/l effusions 12/03>>ESR 110, RF 13  Subjective Fever curve better.  Decreased cough and chest discomfort.  Blood pressure 132/83, pulse 86, temperature 97.7 F (36.5 C), temperature source Oral, resp. rate 20, height 5\' 6"  (1.676 m), weight 162 lb 0.6 oz (73.5 kg), SpO2 96.00%.  PHYICAL EXAM:  General - no distress HEENT - no sinus tenderness Cardiac - s1s2 regular, no murmur Chest - bronchial breath sounds b/l Abd - soft, non tender Ext - no edema Neuro - normal strength Psych - normal mood, behavior  LABS BMET    Component Value Date/Time   NA 134* 06/14/2011 0540   K 3.8 06/14/2011 0540   CL 97 06/14/2011 0540   CO2 25 06/14/2011 0540   GLUCOSE 105* 06/14/2011 0540   BUN 7 06/14/2011 0540   CREATININE 0.68 06/14/2011 0540   CALCIUM 8.3* 06/14/2011 0540   GFRNONAA >90 06/14/2011 0540   GFRAA >90 06/14/2011 0540   Lab Results  Component Value Date   ALT 372* 06/14/2011   AST 184* 06/14/2011   ALKPHOS 155* 06/14/2011   BILITOT 0.4 06/14/2011    CBC    Component Value Date/Time   WBC 20.4* 06/14/2011 0540   RBC 4.03* 06/14/2011 0540   HGB 12.4* 06/14/2011 0540   HCT 35.7* 06/14/2011 0540   PLT 343 06/14/2011 0540   MCV 88.6 06/14/2011 0540   MCH  30.8 06/14/2011 0540   MCHC 34.7 06/14/2011 0540   RDW 13.6 06/14/2011 0540   LYMPHSABS 1.6 06/13/2011 0920   MONOABS 3.6* 06/13/2011 0920   EOSABS 0.0 06/13/2011 0920   BASOSABS 0.2* 06/13/2011 0920    Dg Chest 2 View  06/13/2011  *RADIOLOGY REPORT*  Clinical Data: Shortness of breath.  Cough.  Fever.  CHEST - 2 VIEW  Comparison: The exams back to 06/09/2011  Findings: Patchy asymmetric airspace disease seen bilaterally shows no substantial interval change since yesterday's film, but is progressed since 06/10/2011. Cardiopericardial silhouette is at upper limits of normal for size.  IMPRESSION: Patchy asymmetric airspace disease without substantial change since yesterday's film.  Original Report Authenticated By: ERIC A. MANSELL, M.D.   Ct Chest W Contrast  06/13/2011  *RADIOLOGY REPORT*  Clinical Data: Cough, fever, pneumonia.  Progressive lung infiltrates.  CT CHEST WITH CONTRAST  Technique:  Multidetector CT imaging of the chest was performed following the standard protocol during bolus administration of intravenous contrast.  Contrast:  80 ml Omnipaque-300  Comparison: Chest radiographs obtained earlier today.  Findings: Patchy, confluent and mass-like areas of increased density scattered throughout both lungs.  Some of these areas are developing central cavitation.  These are involving all of the lobes in both lungs.  Mildly enlarged bilateral hilar lymph nodes.  These include a 1.5 x 1.0 cm left hilar lymph node on image number 32 and a 0.9 x 0.7 cm right hilar lymph node  on image number 31.  Small left pleural effusion and minimal right pleural effusion. Unremarkable upper abdomen.  IMPRESSION:  1.  Extensive bilateral cavitary pneumonia. Differential considerations include Staphylococcus, Klebsiella and fungal pneumonia.  This is not a typical distribution for tuberculosis, but this needs to be considered. 2.  Mild bilateral hilar adenopathy.  Original Report Authenticated By: Darrol Angel, M.D.       ASSESSMENT/PLAN:  B/L pulmonary infiltrates with fever -CT chest shows cavitary/necrotizing lesions -likely bacterial pneumonia -?if he could have vasculitis>>seems less likely -doubt tuberculosis -agree with change of Abx to zosyn -will change vancomycin to linezolid for better coverage of Staph in lungs -f/u ANA, ANCA -nebs to prn -f/u CXR intermittently -if no improvement may need bronchoscopy -finish 5 days of tamiflu  Hypoxemia -titrate oxygen to keep SpO2 > 92%  HTN, proteinuria -monitor blood pressure  Hypokalemia -f/u and replace electrolytes as needed  SIRS -d/c IV fluids  Tobacco abuse -will need further education about smoking cessation  Pain -motrin prn  Elevated LFT -f/u LFT -hold tylenol -f/u abd ultrasound  Nathaniel Zuniga 06/14/2011, 10:33 AM Pager:  [phone]

## 2011-06-15 LAB — COMPREHENSIVE METABOLIC PANEL
Albumin: 2.1 g/dL — ABNORMAL LOW (ref 3.5–5.2)
BUN: 8 mg/dL (ref 6–23)
Calcium: 8.4 mg/dL (ref 8.4–10.5)
Creatinine, Ser: 0.59 mg/dL (ref 0.50–1.35)
GFR calc Af Amer: >90 mL/min (ref >90)
Glucose, Bld: 116 mg/dL — ABNORMAL HIGH (ref 70–99)
Potassium: 4.8 mEq/L (ref 3.5–5.1)
Total Protein: 7.1 g/dL (ref 6.0–8.3)

## 2011-06-15 LAB — CBC
HCT: 36.7 % — ABNORMAL LOW (ref 39.0–52.0)
Hemoglobin: 12.9 g/dL — ABNORMAL LOW (ref 13.0–17.0)
MCH: 31.5 pg (ref 26.0–34.0)
MCHC: 35.1 g/dL (ref 30.0–36.0)
MCV: 89.5 fL (ref 78.0–100.0)
RDW: 13.8 % (ref 11.5–15.5)

## 2011-06-15 MED ORDER — LINEZOLID 600 MG PO TABS
600.0000 mg | ORAL_TABLET | Freq: Two times a day (BID) | ORAL | Status: DC
  Administered 2011-06-15 – 2011-06-20 (×10): 600 mg via ORAL
  Filled 2011-06-15 (×12): qty 1

## 2011-06-15 NOTE — Progress Notes (Signed)
Subjective:  S: Patient frustrated with his hospital stay, feels that he is missing a lot of his school. Otherwise still spiking fevers overnight, continues to cough..  Objective: Weight change:   Intake/Output Summary (Last 24 hours) at 06/15/11 1324 Last data filed at 06/15/11 0981  Gross per 24 hour  Intake      0 ml  Output   1000 ml  Net  -1000 ml  Blood pressure 137/81, pulse 76, temperature 99.1 F (37.3 C), temperature source Oral, resp. rate 22, height 5\' 6"  (1.676 m), weight 73.5 kg (162 lb 0.6 oz), SpO2 94.00%.  GEN: Alert and oriented x3 not in acute distress Cvs:S1 and S2 heard.  Respiratory exam : scattered rales, no wheezing heard  Abdomen: soft, non tender, non distended bowel sounds are heard  Extremities: no pedal edema. Cyanosis and clubbing. Neurological exam patient is alert oriented x3 ,  nonfocal   Lab Results: Results for orders placed during the hospital encounter of 06/09/11 (from the past 24 hour(s))  CBC     Status: Abnormal   Collection Time   06/15/11  6:40 AM      Component Value Range   WBC 16.6 (*) 4.0 - 10.5 (K/uL)   RBC 4.10 (*) 4.22 - 5.81 (MIL/uL)   Hemoglobin 12.9 (*) 13.0 - 17.0 (g/dL)   HCT 19.1 (*) 47.8 - 52.0 (%)   MCV 89.5  78.0 - 100.0 (fL)   MCH 31.5  26.0 - 34.0 (pg)   MCHC 35.1  30.0 - 36.0 (g/dL)   RDW 29.5  62.1 - 30.8 (%)   Platelets    150 - 400 (K/uL)   Value: PLATELET CLUMPS NOTED ON SMEAR, COUNT APPEARS ADEQUATE  COMPREHENSIVE METABOLIC PANEL     Status: Abnormal   Collection Time   06/15/11  6:40 AM      Component Value Range   Sodium 133 (*) 135 - 145 (mEq/L)   Potassium 4.8  3.5 - 5.1 (mEq/L)   Chloride 98  96 - 112 (mEq/L)   CO2 24  19 - 32 (mEq/L)   Glucose, Bld 116 (*) 70 - 99 (mg/dL)   BUN 8  6 - 23 (mg/dL)   Creatinine, Ser 6.57  0.50 - 1.35 (mg/dL)   Calcium 8.4  8.4 - 84.6 (mg/dL)   Total Protein 7.1  6.0 - 8.3 (g/dL)   Albumin 2.1 (*) 3.5 - 5.2 (g/dL)   AST 962 (*) 0 - 37 (U/L)   ALT 814 (*) 0 - 53  (U/L)   Alkaline Phosphatase 153 (*) 39 - 117 (U/L)   Total Bilirubin 0.4  0.3 - 1.2 (mg/dL)   GFR calc non Af Amer >90  >90 (mL/min)   GFR calc Af Amer >90  >90 (mL/min)     Micro Results: Recent Results (from the past 240 hour(s))  CULTURE, BLOOD (ROUTINE X 2)     Status: Normal (Preliminary result)   Collection Time   06/10/11  2:12 AM      Component Value Range Status Comment   Specimen Description BLOOD RIGHT FOREARM   Final    Special Requests BOTTLES DRAWN AEROBIC AND ANAEROBIC [facility] EACH   Final    Setup Time [id number]   Final    Culture     Final    Value:        BLOOD CULTURE RECEIVED NO GROWTH TO DATE CULTURE WILL BE HELD FOR 5 DAYS BEFORE ISSUING A FINAL NEGATIVE REPORT   Report Status PENDING  Incomplete   CULTURE, BLOOD (ROUTINE X 2)     Status: Normal (Preliminary result)   Collection Time   06/10/11  2:45 AM      Component Value Range Status Comment   Specimen Description BLOOD RIGHT ARM   Final    Special Requests BOTTLES DRAWN AEROBIC AND ANAEROBIC 10CC EACH   Final    Setup Time [id number]   Final    Culture     Final    Value:        BLOOD CULTURE RECEIVED NO GROWTH TO DATE CULTURE WILL BE HELD FOR 5 DAYS BEFORE ISSUING A FINAL NEGATIVE REPORT   Report Status PENDING   Incomplete   MRSA PCR SCREENING     Status: Normal   Collection Time   06/10/11  3:27 AM      Component Value Range Status Comment   MRSA by PCR NEGATIVE  NEGATIVE  Final   CULTURE, BLOOD (ROUTINE X 2)     Status: Normal (Preliminary result)   Collection Time   06/12/11  4:50 PM      Component Value Range Status Comment   Specimen Description BLOOD LEFT ARM   Final    Special Requests BOTTLES DRAWN AEROBIC AND ANAEROBIC 10CC   Final    Setup Time [id number]   Final    Culture     Final    Value:        BLOOD CULTURE RECEIVED NO GROWTH TO DATE CULTURE WILL BE HELD FOR 5 DAYS BEFORE ISSUING A FINAL NEGATIVE REPORT   Report Status PENDING   Incomplete   CULTURE, BLOOD (ROUTINE X 2)      Status: Normal (Preliminary result)   Collection Time   06/12/11  5:00 PM      Component Value Range Status Comment   Specimen Description BLOOD LEFT WRIST   Final    Special Requests BOTTLES DRAWN AEROBIC AND ANAEROBIC 10CC   Final    Setup Time [id number]   Final    Culture     Final    Value:        BLOOD CULTURE RECEIVED NO GROWTH TO DATE CULTURE WILL BE HELD FOR 5 DAYS BEFORE ISSUING A FINAL NEGATIVE REPORT   Report Status PENDING   Incomplete   CULTURE, SPUTUM-ASSESSMENT     Status: Normal   Collection Time   06/14/11 12:33 PM      Component Value Range Status Comment   Specimen Description SPUTUM   Final    Special Requests NONE   Final    Sputum evaluation     Final    Value: THIS SPECIMEN IS ACCEPTABLE. RESPIRATORY CULTURE REPORT TO FOLLOW.   Report Status 06/14/2011 FINAL   Final   CULTURE, RESPIRATORY     Status: Normal (Preliminary result)   Collection Time   06/14/11 12:33 PM      Component Value Range Status Comment   Specimen Description SPUTUM   Final    Special Requests NONE   Final    Gram Stain     Final    Value: ABUNDANT WBC PRESENT,BOTH PMN AND MONONUCLEAR     RARE SQUAMOUS EPITHELIAL CELLS PRESENT     RARE GRAM POSITIVE COCCI     IN PAIRS   Culture NORMAL OROPHARYNGEAL FLORA   Final    Report Status PENDING   Incomplete     Studies/Results: Dg Chest 2 View  06/13/2011  *RADIOLOGY REPORT*  Clinical Data: Shortness of breath.  Cough.  Fever.  CHEST - 2 VIEW  Comparison: The exams back to 06/09/2011  Findings: Patchy asymmetric airspace disease seen bilaterally shows no substantial interval change since yesterday's film, but is progressed since 06/10/2011. Cardiopericardial silhouette is at upper limits of normal for size.  IMPRESSION: Patchy asymmetric airspace disease without substantial change since yesterday's film.  Original Report Authenticated By: ERIC A. MANSELL, M.D.   Dg Chest 2 View  06/09/2011  *RADIOLOGY REPORT*  Clinical Data: Cough,  congestion, fever and shortness of breath.  CHEST - 2 VIEW  Comparison: None.  Findings: Multifocal infiltrates noted involving the upper and lower lung zones bilaterally.  Some of these areas are nodular in appearance and follow-up chest x-ray recommended.  There likely are tiny bilateral pleural effusions.  No edema.  Heart size and mediastinal contours are within normal limits.  IMPRESSION: Multifocal bilateral pneumonia.  Original Report Authenticated By: Reola Calkins, M.D.   Ct Chest W Contrast  06/13/2011  *RADIOLOGY REPORT*  Clinical Data: Cough, fever, pneumonia.  Progressive lung infiltrates.  CT CHEST WITH CONTRAST  Technique:  Multidetector CT imaging of the chest was performed following the standard protocol during bolus administration of intravenous contrast.  Contrast:  80 ml Omnipaque-300  Comparison: Chest radiographs obtained earlier today.  Findings: Patchy, confluent and mass-like areas of increased density scattered throughout both lungs.  Some of these areas are developing central cavitation.  These are involving all of the lobes in both lungs.  Mildly enlarged bilateral hilar lymph nodes.  These include a 1.5 x 1.0 cm left hilar lymph node on image number 32 and a 0.9 x 0.7 cm right hilar lymph node on image number 31.  Small left pleural effusion and minimal right pleural effusion. Unremarkable upper abdomen.  IMPRESSION:  1.  Extensive bilateral cavitary pneumonia. Differential considerations include Staphylococcus, Klebsiella and fungal pneumonia.  This is not a typical distribution for tuberculosis, but this needs to be considered. 2.  Mild bilateral hilar adenopathy.  Original Report Authenticated By: Darrol Angel, M.D.   Dg Chest Port 1 View  06/12/2011  *RADIOLOGY REPORT*  Clinical Data: Pneumonia.  PORTABLE CHEST - 1 VIEW  Comparison: 06/10/2011  Findings: Patchy bilateral airspace disease again noted, unchanged. There is consolidation in both lower lobes and upper lobes.  Suspect trace effusions.  IMPRESSION: No significant change.  Original Report Authenticated By: Cyndie Chime, M.D.   Dg Chest Port 1 View  06/10/2011  *RADIOLOGY REPORT*  Clinical Data: Pneumonia follow up.  PORTABLE CHEST - 1 VIEW  Comparison: 06/09/2011.  Findings: Progressive bilateral patchy consolidation consistent with multifocal pneumonia possibly with associated atelectasis in the lung bases.  Recommend follow-up until complete clearance to exclude underlying mass.  No gross pneumothorax.  Central pulmonary vascular prominence.  Heart size top normal.  IMPRESSION: Progressive bilateral multifocal pneumonia suspected as noted above.  Original Report Authenticated By: Fuller Canada, M.D.   Medications: Scheduled Meds:    . ampicillin-sulbactam (UNASYN) IV  3 g Intravenous Once  . ampicillin-sulbactam (UNASYN) IV  3 g Intravenous Q6H  . enoxaparin (LOVENOX) injection  40 mg Subcutaneous Q24H  . linezolid  600 mg Intravenous BID  . oseltamivir  75 mg Oral BID  . potassium chloride  40 mEq Oral BID  . DISCONTD: piperacillin-tazobactam  3.375 g Intravenous Q8H   Continuous Infusions:  PRN Meds:.alum & mag hydroxide-simeth, ibuprofen, levalbuterol, ondansetron (ZOFRAN) IV, ondansetron  Assessment/Plan:   Acute hypoxic respiratory failure/bilateral cavitary pneumonia:  - currently  patient is on Unasyn and Zyvox. We'll continue Tamiflu for 5 days. Nasal oxygen, nebulizers as needed, - Blood cultures negative so for, influenza negative, pulmonary and infectious disease following  - Rule out TB, AFB cultures, quantiferon gold pending. If cultures negative and patient still spiking fevers, will consult pulmonology for bronchoscopy and biopsies  Elevated liver function tests : secondary to disease process.  - Hepatitis serologies pending ultrasound of the abdomen is pending, repeat LFTs shows elevated AST of 456, ALT 814  Tobacco abuse tobacco cessation counseling given.  Persistent  leukocytosis secondary to resolving pneumonia.   LOS: 6 days   Deyanira Fesler 06/15/2011, 1:24 PM

## 2011-06-15 NOTE — Progress Notes (Addendum)
Subjective: Still with fevers, feels more tired today  Objective: Weight change:   Intake/Output Summary (Last 24 hours) at 06/15/11 1851 Last data filed at 06/15/11 1610  Gross per 24 hour  Intake      0 ml  Output   1000 ml  Net  -1000 ml   Blood pressure 137/66, pulse 73, temperature 99.7 F (37.6 C), temperature source Oral, resp. rate 23, height 5\' 6"  (1.676 m), weight 162 lb 0.6 oz (73.5 kg), SpO2 96.00%. Temp:  [99 F (37.2 C)-100.8 F (38.2 C)] 99.7 F (37.6 C) (12/05 1455) Pulse Rate:  [73-76] 73  (12/05 1455) Resp:  [20-23] 23  (12/05 1455) BP: (137-148)/(66-97) 137/66 mmHg (12/05 1455) SpO2:  [94 %-96 %] 96 % (12/05 1455)  Physical Exam: Generoal : more fatigued appearing today HEENT: anicteric sclera, pupils reactive to light and accommodation, EOMI, oropharynx clear and without exudate  CVS regular rate, normal r, no murmur rubs or gallops  Chest: few crackles at bases biltarerally, no wheezing, rales or rhonchi  Abdomen: soft nontender, nondistended, normal bowel sounds,  Extremities: no clubbing or edema noted bilaterally  Skin: no rashes  Neuro: nonfocal, strength and sensation intact   Lab Results:  Basename 06/15/11 0640 06/14/11 0540  WBC 16.6* 20.4*  HGB 12.9* 12.4*  HCT 36.7* 35.7*  PLT PLATELET CLUMPS NOTED ON SMEAR, COUNT APPEARS ADEQUATE 343   BMET  Basename 06/15/11 0640 06/14/11 0540  NA 133* 134*  K 4.8 3.8  CL 98 97  CO2 24 25  GLUCOSE 116* 105*  BUN 8 7  CREATININE 0.59 0.68  CALCIUM 8.4 8.3*    Micro Results: Recent Results (from the past 240 hour(s))  CULTURE, BLOOD (ROUTINE X 2)     Status: Normal (Preliminary result)   Collection Time   06/10/11  2:12 AM      Component Value Range Status Comment   Specimen Description BLOOD RIGHT FOREARM   Final    Special Requests BOTTLES DRAWN AEROBIC AND ANAEROBIC 5CC EACH   Final    Setup Time [id number]   Final    Culture     Final    Value:        BLOOD CULTURE RECEIVED NO  GROWTH TO DATE CULTURE WILL BE HELD FOR 5 DAYS BEFORE ISSUING A FINAL NEGATIVE REPORT   Report Status PENDING   Incomplete   CULTURE, BLOOD (ROUTINE X 2)     Status: Normal (Preliminary result)   Collection Time   06/10/11  2:45 AM      Component Value Range Status Comment   Specimen Description BLOOD RIGHT ARM   Final    Special Requests BOTTLES DRAWN AEROBIC AND ANAEROBIC 10CC EACH   Final    Setup Time [id number]   Final    Culture     Final    Value:        BLOOD CULTURE RECEIVED NO GROWTH TO DATE CULTURE WILL BE HELD FOR 5 DAYS BEFORE ISSUING A FINAL NEGATIVE REPORT   Report Status PENDING   Incomplete   MRSA PCR SCREENING     Status: Normal   Collection Time   06/10/11  3:27 AM      Component Value Range Status Comment   MRSA by PCR NEGATIVE  NEGATIVE  Final   CULTURE, BLOOD (ROUTINE X 2)     Status: Normal (Preliminary result)   Collection Time   06/12/11  4:50 PM      Component Value Range Status Comment  Specimen Description BLOOD LEFT ARM   Final    Special Requests BOTTLES DRAWN AEROBIC AND ANAEROBIC 10CC   Final    Setup Time [id number]   Final    Culture     Final    Value:        BLOOD CULTURE RECEIVED NO GROWTH TO DATE CULTURE WILL BE HELD FOR 5 DAYS BEFORE ISSUING A FINAL NEGATIVE REPORT   Report Status PENDING   Incomplete   CULTURE, BLOOD (ROUTINE X 2)     Status: Normal (Preliminary result)   Collection Time   06/12/11  5:00 PM      Component Value Range Status Comment   Specimen Description BLOOD LEFT WRIST   Final    Special Requests BOTTLES DRAWN AEROBIC AND ANAEROBIC 10CC   Final    Setup Time [id number]   Final    Culture     Final    Value:        BLOOD CULTURE RECEIVED NO GROWTH TO DATE CULTURE WILL BE HELD FOR 5 DAYS BEFORE ISSUING A FINAL NEGATIVE REPORT   Report Status PENDING   Incomplete   CULTURE, SPUTUM-ASSESSMENT     Status: Normal   Collection Time   06/14/11 12:33 PM      Component Value Range Status Comment   Specimen Description  SPUTUM   Final    Special Requests NONE   Final    Sputum evaluation     Final    Value: THIS SPECIMEN IS ACCEPTABLE. RESPIRATORY CULTURE REPORT TO FOLLOW.   Report Status 06/14/2011 FINAL   Final   AFB CULTURE WITH SMEAR     Status: Normal (Preliminary result)   Collection Time   06/14/11 12:33 PM      Component Value Range Status Comment   Specimen Description SPUTUM   Final    Special Requests NONE   Final    ACID FAST SMEAR NO ACID FAST BACILLI SEEN   Final    Culture     Final    Value: CULTURE WILL BE EXAMINED FOR 6 WEEKS BEFORE ISSUING A FINAL REPORT   Report Status PENDING   Incomplete   CULTURE, RESPIRATORY     Status: Normal (Preliminary result)   Collection Time   06/14/11 12:33 PM      Component Value Range Status Comment   Specimen Description SPUTUM   Final    Special Requests NONE   Final    Gram Stain     Final    Value: ABUNDANT WBC PRESENT,BOTH PMN AND MONONUCLEAR     RARE SQUAMOUS EPITHELIAL CELLS PRESENT     RARE GRAM POSITIVE COCCI     IN PAIRS   Culture NORMAL OROPHARYNGEAL FLORA   Final    Report Status PENDING   Incomplete     Studies/Results: Dg Chest 2 View  06/13/2011  *RADIOLOGY REPORT*  Clinical Data: Shortness of breath.  Cough.  Fever.  CHEST - 2 VIEW  Comparison: The exams back to 06/09/2011  Findings: Patchy asymmetric airspace disease seen bilaterally shows no substantial interval change since yesterday's film, but is progressed since 06/10/2011. Cardiopericardial silhouette is at upper limits of normal for size.  IMPRESSION: Patchy asymmetric airspace disease without substantial change since yesterday's film.  Original Report Authenticated By: ERIC A. MANSELL, M.D.   Dg Chest 2 View  06/09/2011  *RADIOLOGY REPORT*  Clinical Data: Cough, congestion, fever and shortness of breath.  CHEST - 2 VIEW  Comparison: None.  Findings: Multifocal  infiltrates noted involving the upper and lower lung zones bilaterally.  Some of these areas are nodular in  appearance and follow-up chest x-ray recommended.  There likely are tiny bilateral pleural effusions.  No edema.  Heart size and mediastinal contours are within normal limits.  IMPRESSION: Multifocal bilateral pneumonia.  Original Report Authenticated By: Reola Calkins, M.D.   Ct Chest W Contrast  06/13/2011  *RADIOLOGY REPORT*  Clinical Data: Cough, fever, pneumonia.  Progressive lung infiltrates.  CT CHEST WITH CONTRAST  Technique:  Multidetector CT imaging of the chest was performed following the standard protocol during bolus administration of intravenous contrast.  Contrast:  80 ml Omnipaque-300  Comparison: Chest radiographs obtained earlier today.  Findings: Patchy, confluent and mass-like areas of increased density scattered throughout both lungs.  Some of these areas are developing central cavitation.  These are involving all of the lobes in both lungs.  Mildly enlarged bilateral hilar lymph nodes.  These include a 1.5 x 1.0 cm left hilar lymph node on image number 32 and a 0.9 x 0.7 cm right hilar lymph node on image number 31.  Small left pleural effusion and minimal right pleural effusion. Unremarkable upper abdomen.  IMPRESSION:  1.  Extensive bilateral cavitary pneumonia. Differential considerations include Staphylococcus, Klebsiella and fungal pneumonia.  This is not a typical distribution for tuberculosis, but this needs to be considered. 2.  Mild bilateral hilar adenopathy.  Original Report Authenticated By: Darrol Angel, M.D.   US Abdomen Complete  06/14/2011  *RADIOLOGY REPORT*  Clinical Data:  Increased LFTs  ABDOMINAL ULTRASOUND COMPLETE  Comparison:  Chest CT 06/13/2011  Findings:  Gallbladder:  No gallstones, gallbladder wall thickening, or pericholecystic fluid. Sonographic Murphy's sign is negative.  Common Bile Duct:  Within normal limits in caliber.  Liver: No focal mass lesion identified.  Within normal limits in parenchymal echogenicity.  IVC:  Appears normal.  Pancreas:  No  abnormality identified.  Spleen:  Within normal limits in size and echotexture.  Right kidney:  Normal in size and parenchymal echogenicity.  No evidence of mass or hydronephrosis.  Left kidney:  Normal in size and parenchymal echogenicity.  No evidence of mass or hydronephrosis.  Abdominal Aorta:  No aneurysm identified.  A left pleural effusion is noted.  IMPRESSION:  1.  Negative abdominal ultrasound. 2.  Left pleural effusion visualized.  Original Report Authenticated By: Britta Mccreedy, M.D.   Dg Chest Port 1 View  06/12/2011  *RADIOLOGY REPORT*  Clinical Data: Pneumonia.  PORTABLE CHEST - 1 VIEW  Comparison: 06/10/2011  Findings: Patchy bilateral airspace disease again noted, unchanged. There is consolidation in both lower lobes and upper lobes. Suspect trace effusions.  IMPRESSION: No significant change.  Original Report Authenticated By: Cyndie Chime, M.D.   Dg Chest Port 1 View  06/10/2011  *RADIOLOGY REPORT*  Clinical Data: Pneumonia follow up.  PORTABLE CHEST - 1 VIEW  Comparison: 06/09/2011.  Findings: Progressive bilateral patchy consolidation consistent with multifocal pneumonia possibly with associated atelectasis in the lung bases.  Recommend follow-up until complete clearance to exclude underlying mass.  No gross pneumothorax.  Central pulmonary vascular prominence.  Heart size top normal.  IMPRESSION: Progressive bilateral multifocal pneumonia suspected as noted above.  Original Report Authenticated By: Fuller Canada, M.D.    Antibiotics:  Anti-infectives     Start     Dose/Rate Route Frequency Ordered Stop   06/15/11 0000  Ampicillin-Sulbactam (UNASYN) 3 g in sodium chloride 0.9 % 100 mL IVPB  3 g 100 mL/hr over 60 Minutes Intravenous Every 6 hours 06/14/11 1644     06/14/11 1800   moxifloxacin (AVELOX) tablet 400 mg  Status:  Discontinued        400 mg Oral Daily-1800 06/13/11 1445 06/13/11 1721   06/14/11 1730  Ampicillin-Sulbactam (UNASYN) 3 g in sodium chloride 0.9 %  100 mL IVPB       3 g 100 mL/hr over 60 Minutes Intravenous  Once 06/14/11 1644     06/14/11 1200   linezolid (ZYVOX) IVPB 600 mg        600 mg 300 mL/hr over 60 Minutes Intravenous 2 times daily 06/14/11 1042     06/14/11 1100   piperacillin-tazobactam (ZOSYN) IVPB 3.375 g  Status:  Discontinued        3.375 g 12.5 mL/hr over 240 Minutes Intravenous Every 8 hours 06/14/11 0923 06/14/11 1622   06/14/11 0930   vancomycin (VANCOCIN) IVPB 1000 mg/200 mL premix  Status:  Discontinued        1,000 mg 200 mL/hr over 60 Minutes Intravenous Every 8 hours 06/14/11 0917 06/14/11 1042   06/14/11 0930   piperacillin-tazobactam (ZOSYN) IVPB 3.375 g  Status:  Discontinued        3.375 g 100 mL/hr over 30 Minutes Intravenous Every 8 hours 06/14/11 0919 06/14/11 0921   06/14/11 0000   cefTRIAXone (ROCEPHIN) 1 g in dextrose 5 % 50 mL IVPB  Status:  Discontinued        1 g 100 mL/hr over 30 Minutes Intravenous Every 24 hours 06/13/11 1725 06/14/11 0840   06/14/11 0000   azithromycin (ZITHROMAX) 500 mg in dextrose 5 % 250 mL IVPB  Status:  Discontinued        500 mg 250 mL/hr over 60 Minutes Intravenous Every 24 hours 06/13/11 1725 06/14/11 0837   06/10/11 1430   vancomycin (VANCOCIN) IVPB 1000 mg/200 mL premix  Status:  Discontinued        1,000 mg 200 mL/hr over 60 Minutes Intravenous Every 8 hours 06/10/11 1358 06/11/11 1925   06/10/11 1000   oseltamivir (TAMIFLU) capsule 75 mg     Comments: FOR FIVE DAYS      75 mg Oral 2 times daily 06/10/11 0128     06/09/11 2115   cefTRIAXone (ROCEPHIN) 1 g in dextrose 5 % 50 mL IVPB  Status:  Discontinued        1 g 100 mL/hr over 30 Minutes Intravenous Every 24 hours 06/09/11 2110 06/13/11 1718   06/09/11 2115   azithromycin (ZITHROMAX) 500 mg in dextrose 5 % 250 mL IVPB  Status:  Discontinued        500 mg 250 mL/hr over 60 Minutes Intravenous Every 24 hours 06/09/11 2110 06/13/11 1718          Medications: Scheduled Meds:   .  ampicillin-sulbactam (UNASYN) IV  3 g Intravenous Once  . ampicillin-sulbactam (UNASYN) IV  3 g Intravenous Q6H  . enoxaparin (LOVENOX) injection  40 mg Subcutaneous Q24H  . linezolid  600 mg Intravenous BID  . oseltamivir  75 mg Oral BID  . potassium chloride  40 mEq Oral BID   Continuous Infusions:  PRN Meds:.alum & mag hydroxide-simeth, ibuprofen, levalbuterol, ondansetron (ZOFRAN) IV, ondansetron  Assessment/Plan: Nathaniel Zuniga is a 35 y.o. male with hx of tobacco, marijuana use and heavy etoh use who has a multifocal cavitary pneumonia that has not responded to typical CAP coverage    1) Cavitary PNeumonia:  Differential includes MRSA/MSSA pneumonia in context of influenza infection or post influenza. Certainly an progressive aspiration pneumonia that could have festered after his losing consciousness is possible. Finally we need to consider possibility of TB and of fungal infection. Interestingly Nathaniel Zuniga and African Americans have increase risk for infection with coccidioides inmitus and this patient has exposure hx with [place]x of [place]. AFB sputum negative x 1  --continue zyvox and unasyn, (change zyvox to po) --collect AFB cultures x2 -- fu/ check quantiferon gold  --if he is still febrile and not responding to above mentioned antibiotics then he needs bronchoscopy for BAL for AFB, fungal organisms, quantitative cultures, nocardia  ---agree with finish 5 days of tamiflu  IP:  --continue airborne precautions, if we rule him out for TB (and please do not DC airborne without talking to me first then we should downgrade to droplet precatuions for flu given possibilyt that his flu pcr could have been falsey negative    LOS: 6 days   Acey Lav 06/15/2011, 6:51 PM

## 2011-06-15 NOTE — Progress Notes (Signed)
HISTORY of PRESENT ILLNESS: Nathaniel Zuniga is a 35 y.o. male smoker admitted on 06/09/2011 with multi-focal pulmonary infiltrates, fever, hypoxemia.  His symptoms started 11/22.  His twin brother was recently dx with flu.  Found to have cavitary b/l infiltrates on CT chest.  Cultures: 11/30 Flu A / B>>>negative 11/30 H1N1>>>negative 11/30 MRSA PCR>>>negative 11/30 HIV>>>Nonreactive 11/30 BCx2>>>negative 11/30 UA>>>negative 12/02 BCx2>> 12/04 Sputum>>  Abx: 11/29 Rocephin>>>12/03 11/29 Zithromax>>>12/03 11/29 Tamiflu>>> 11/30 Vanc>>12/1 12/03 Zosyn>>12/04 12/03 Vancomycin>>12/04 12/04 Linezolid>> 12/04 Unasyn>>  Tests: 12/03 CT chest>>patchy, confluent mass like densities b/l with central cavitation, Lt hilar node 1.5 x 1 cm, Rt hilar node 0.9 x 0.7 cm, small b/l effusions 12/03>>ESR 110, RF 13, ANA negative, anti-CCP < 2  Subjective Feels better.  Three doses of motrin since yesterday.  Decreased cough.  Still feels tired.  Blood pressure 137/81, pulse 76, temperature 99.1 F (37.3 C), temperature source Oral, resp. rate 22, height 5\' 6"  (1.676 m), weight 162 lb 0.6 oz (73.5 kg), SpO2 94.00%.  PHYICAL EXAM:  General - no distress HEENT - no sinus tenderness Cardiac - s1s2 regular, no murmur Chest - bronchial breath sounds b/l Abd - soft, non tender Ext - no edema Neuro - normal strength Psych - normal mood, behavior  LABS BMET    Component Value Date/Time   NA 133* 06/15/2011 0640   K 4.8 06/15/2011 0640   CL 98 06/15/2011 0640   CO2 24 06/15/2011 0640   GLUCOSE 116* 06/15/2011 0640   BUN 8 06/15/2011 0640   CREATININE 0.59 06/15/2011 0640   CALCIUM 8.4 06/15/2011 0640   GFRNONAA >90 06/15/2011 0640   GFRAA >90 06/15/2011 0640   Lab Results  Component Value Date   ALT 814* 06/15/2011   AST 456* 06/15/2011   ALKPHOS 153* 06/15/2011   BILITOT 0.4 06/15/2011    CBC    Component Value Date/Time   WBC 16.6* 06/15/2011 0640   RBC 4.10* 06/15/2011 0640   HGB 12.9*  06/15/2011 0640   HCT 36.7* 06/15/2011 0640   PLT PLATELET CLUMPS NOTED ON SMEAR, COUNT APPEARS ADEQUATE 06/15/2011 0640   MCV 89.5 06/15/2011 0640   MCH 31.5 06/15/2011 0640   MCHC 35.1 06/15/2011 0640   RDW 13.8 06/15/2011 0640   LYMPHSABS 1.6 06/13/2011 0920   MONOABS 3.6* 06/13/2011 0920   EOSABS 0.0 06/13/2011 0920   BASOSABS 0.2* 06/13/2011 0920    Ct Chest W Contrast  06/13/2011  *RADIOLOGY REPORT*  Clinical Data: Cough, fever, pneumonia.  Progressive lung infiltrates.  CT CHEST WITH CONTRAST  Technique:  Multidetector CT imaging of the chest was performed following the standard protocol during bolus administration of intravenous contrast.  Contrast:  80 ml Omnipaque-300  Comparison: Chest radiographs obtained earlier today.  Findings: Patchy, confluent and mass-like areas of increased density scattered throughout both lungs.  Some of these areas are developing central cavitation.  These are involving all of the lobes in both lungs.  Mildly enlarged bilateral hilar lymph nodes.  These include a 1.5 x 1.0 cm left hilar lymph node on image number 32 and a 0.9 x 0.7 cm right hilar lymph node on image number 31.  Small left pleural effusion and minimal right pleural effusion. Unremarkable upper abdomen.  IMPRESSION:  1.  Extensive bilateral cavitary pneumonia. Differential considerations include Staphylococcus, Klebsiella and fungal pneumonia.  This is not a typical distribution for tuberculosis, but this needs to be considered. 2.  Mild bilateral hilar adenopathy.  Original Report Authenticated By: Londell Moh  Azucena Kuba, M.D.   US Abdomen Complete  06/14/2011  *RADIOLOGY REPORT*  Clinical Data:  Increased LFTs  ABDOMINAL ULTRASOUND COMPLETE  Comparison:  Chest CT 06/13/2011  Findings:  Gallbladder:  No gallstones, gallbladder wall thickening, or pericholecystic fluid. Sonographic Murphy's sign is negative.  Common Bile Duct:  Within normal limits in caliber.  Liver: No focal mass lesion identified.  Within normal  limits in parenchymal echogenicity.  IVC:  Appears normal.  Pancreas:  No abnormality identified.  Spleen:  Within normal limits in size and echotexture.  Right kidney:  Normal in size and parenchymal echogenicity.  No evidence of mass or hydronephrosis.  Left kidney:  Normal in size and parenchymal echogenicity.  No evidence of mass or hydronephrosis.  Abdominal Aorta:  No aneurysm identified.  A left pleural effusion is noted.  IMPRESSION:  1.  Negative abdominal ultrasound. 2.  Left pleural effusion visualized.  Original Report Authenticated By: Britta Mccreedy, M.D.      ASSESSMENT/PLAN:  B/L pulmonary infiltrates with fever -CT chest shows cavitary/necrotizing lesions -likely bacterial pneumonia -?if he could have vasculitis>>seems less likely; f/u ANCA -doubt tuberculosis>>f/u sputum for AFB -continue linezolid, unasyn per ID -nebs to prn -f/u CXR 12/06 -if no improvement may need bronchoscopy -finish 5 days of tamiflu per ID  Hypoxemia -titrate oxygen to keep SpO2 > 92%  Tobacco abuse -will need further education about smoking cessation  Pain, fever -motrin prn  Elevated LFT -f/u LFT per primary team -hold tylenol   Montine Hight 06/15/2011, 1:38 PM Pager:  [phone]

## 2011-06-16 ENCOUNTER — Inpatient Hospital Stay (HOSPITAL_COMMUNITY): Payer: Self-pay

## 2011-06-16 DIAGNOSIS — J9600 Acute respiratory failure, unspecified whether with hypoxia or hypercapnia: Secondary | ICD-10-CM

## 2011-06-16 DIAGNOSIS — J189 Pneumonia, unspecified organism: Secondary | ICD-10-CM

## 2011-06-16 DIAGNOSIS — R0902 Hypoxemia: Secondary | ICD-10-CM

## 2011-06-16 DIAGNOSIS — R509 Fever, unspecified: Secondary | ICD-10-CM

## 2011-06-16 LAB — CULTURE, BLOOD (ROUTINE X 2)
Culture  Setup Time: 201211301047
Culture: NO GROWTH
Culture: NO GROWTH

## 2011-06-16 LAB — AFB CULTURE WITH SMEAR (NOT AT ARMC)

## 2011-06-16 LAB — CULTURE, RESPIRATORY W GRAM STAIN

## 2011-06-16 LAB — HEPATITIS PANEL, ACUTE
Hep A IgM: NEGATIVE
Hep B C IgM: NEGATIVE
Hepatitis B Surface Ag: NEGATIVE

## 2011-06-16 LAB — CBC
HCT: 36.2 % — ABNORMAL LOW (ref 39.0–52.0)
Hemoglobin: 12.5 g/dL — ABNORMAL LOW (ref 13.0–17.0)
MCHC: 34.5 g/dL (ref 30.0–36.0)
RBC: 4.02 MIL/uL — ABNORMAL LOW (ref 4.22–5.81)
WBC: 21.4 10*3/uL — ABNORMAL HIGH (ref 4.0–10.5)

## 2011-06-16 LAB — MPO/PR-3 (ANCA) ANTIBODIES

## 2011-06-16 LAB — QUANTIFERON TB GOLD ASSAY (BLOOD): Interferon Gamma Release Assay: NEGATIVE

## 2011-06-16 MED ORDER — METRONIDAZOLE 500 MG PO TABS
500.0000 mg | ORAL_TABLET | Freq: Three times a day (TID) | ORAL | Status: DC
  Administered 2011-06-16 – 2011-06-20 (×12): 500 mg via ORAL
  Filled 2011-06-16 (×15): qty 1

## 2011-06-16 NOTE — Progress Notes (Signed)
Subjective: Still with fevers, feels better, more energy  Objective: Weight change:   Intake/Output Summary (Last 24 hours) at 06/16/11 1110 Last data filed at 06/16/11 0600  Gross per 24 hour  Intake    580 ml  Output    300 ml  Net    280 ml   Blood pressure 130/78, pulse 76, temperature 98.8 F (37.1 C), temperature source Oral, resp. rate 20, height 5\' 6"  (1.676 m), weight 162 lb 0.6 oz (73.5 kg), SpO2 97.00%. Temp:  [98.8 F (37.1 C)-100.8 F (38.2 C)] 98.8 F (37.1 C) (12/06 0700) Pulse Rate:  [73-93] 76  (12/06 0700) Resp:  [20-23] 20  (12/06 0700) BP: (130-155)/(66-97) 130/78 mmHg (12/06 0700) SpO2:  [96 %-97 %] 97 % (12/06 0700)  Physical Exam: Generoal : alert oriented,  HEENT: anicteric sclera, pupils reactive to light and accommodation, EOMI, oropharynx clear and without exudate  CVS regular rate, normal r, no murmur rubs or gallops  Chest:rackles at bases biltarerally, no wheezing Abdomen: soft nontender, nondistended, normal bowel sounds,  Extremities: no clubbing or edema noted bilaterally  Skin: no rashes  Neuro: nonfocal, strength and sensation intact   Lab Results:  Basename 06/16/11 0604 06/15/11 0640  WBC 21.4* 16.6*  HGB 12.5* 12.9*  HCT 36.2* 36.7*  PLT 414* PLATELET CLUMPS NOTED ON SMEAR, COUNT APPEARS ADEQUATE   BMET  Basename 06/15/11 0640 06/14/11 0540  NA 133* 134*  K 4.8 3.8  CL 98 97  CO2 24 25  GLUCOSE 116* 105*  BUN 8 7  CREATININE 0.59 0.68  CALCIUM 8.4 8.3*    Micro Results: Recent Results (from the past 240 hour(s))  CULTURE, BLOOD (ROUTINE X 2)     Status: Normal   Collection Time   06/10/11  2:12 AM      Component Value Range Status Comment   Specimen Description BLOOD RIGHT FOREARM   Final    Special Requests BOTTLES DRAWN AEROBIC AND ANAEROBIC [facility] EACH   Final    Setup Time [id number]   Final    Culture NO GROWTH 5 DAYS   Final    Report Status 06/16/2011 FINAL   Final   CULTURE, BLOOD (ROUTINE X 2)     Status:  Normal   Collection Time   06/10/11  2:45 AM      Component Value Range Status Comment   Specimen Description BLOOD RIGHT ARM   Final    Special Requests BOTTLES DRAWN AEROBIC AND ANAEROBIC 10CC EACH   Final    Setup Time [id number]   Final    Culture NO GROWTH 5 DAYS   Final    Report Status 06/16/2011 FINAL   Final   MRSA PCR SCREENING     Status: Normal   Collection Time   06/10/11  3:27 AM      Component Value Range Status Comment   MRSA by PCR NEGATIVE  NEGATIVE  Final   CULTURE, BLOOD (ROUTINE X 2)     Status: Normal (Preliminary result)   Collection Time   06/12/11  4:50 PM      Component Value Range Status Comment   Specimen Description BLOOD LEFT ARM   Final    Special Requests BOTTLES DRAWN AEROBIC AND ANAEROBIC 10CC   Final    Setup Time [id number]   Final    Culture     Final    Value:        BLOOD CULTURE RECEIVED NO GROWTH TO DATE CULTURE WILL BE HELD  FOR 5 DAYS BEFORE ISSUING A FINAL NEGATIVE REPORT   Report Status PENDING   Incomplete   CULTURE, BLOOD (ROUTINE X 2)     Status: Normal (Preliminary result)   Collection Time   06/12/11  5:00 PM      Component Value Range Status Comment   Specimen Description BLOOD LEFT WRIST   Final    Special Requests BOTTLES DRAWN AEROBIC AND ANAEROBIC 10CC   Final    Setup Time [id number]   Final    Culture     Final    Value:        BLOOD CULTURE RECEIVED NO GROWTH TO DATE CULTURE WILL BE HELD FOR 5 DAYS BEFORE ISSUING A FINAL NEGATIVE REPORT   Report Status PENDING   Incomplete   CULTURE, SPUTUM-ASSESSMENT     Status: Normal   Collection Time   06/14/11 12:33 PM      Component Value Range Status Comment   Specimen Description SPUTUM   Final    Special Requests NONE   Final    Sputum evaluation     Final    Value: THIS SPECIMEN IS ACCEPTABLE. RESPIRATORY CULTURE REPORT TO FOLLOW.   Report Status 06/14/2011 FINAL   Final   AFB CULTURE WITH SMEAR     Status: Normal (Preliminary result)   Collection Time   06/14/11 12:33  PM      Component Value Range Status Comment   Specimen Description SPUTUM   Final    Special Requests NONE   Final    ACID FAST SMEAR NO ACID FAST BACILLI SEEN   Final    Culture     Final    Value: CULTURE WILL BE EXAMINED FOR 6 WEEKS BEFORE ISSUING A FINAL REPORT   Report Status PENDING   Incomplete   CULTURE, RESPIRATORY     Status: Normal   Collection Time   06/14/11 12:33 PM      Component Value Range Status Comment   Specimen Description SPUTUM   Final    Special Requests NONE   Final    Gram Stain     Final    Value: ABUNDANT WBC PRESENT,BOTH PMN AND MONONUCLEAR     RARE SQUAMOUS EPITHELIAL CELLS PRESENT     RARE GRAM POSITIVE COCCI     IN PAIRS   Culture NORMAL OROPHARYNGEAL FLORA   Final    Report Status 06/16/2011 FINAL   Final     Studies/Results: Dg Chest 2 View  06/13/2011  *RADIOLOGY REPORT*  Clinical Data: Shortness of breath.  Cough.  Fever.  CHEST - 2 VIEW  Comparison: The exams back to 06/09/2011  Findings: Patchy asymmetric airspace disease seen bilaterally shows no substantial interval change since yesterday's film, but is progressed since 06/10/2011. Cardiopericardial silhouette is at upper limits of normal for size.  IMPRESSION: Patchy asymmetric airspace disease without substantial change since yesterday's film.  Original Report Authenticated By: ERIC A. MANSELL, M.D.   Dg Chest 2 View  06/09/2011  *RADIOLOGY REPORT*  Clinical Data: Cough, congestion, fever and shortness of breath.  CHEST - 2 VIEW  Comparison: None.  Findings: Multifocal infiltrates noted involving the upper and lower lung zones bilaterally.  Some of these areas are nodular in appearance and follow-up chest x-ray recommended.  There likely are tiny bilateral pleural effusions.  No edema.  Heart size and mediastinal contours are within normal limits.  IMPRESSION: Multifocal bilateral pneumonia.  Original Report Authenticated By: Reola Calkins, M.D.   Ct Chest  W Contrast  06/13/2011   *RADIOLOGY REPORT*  Clinical Data: Cough, fever, pneumonia.  Progressive lung infiltrates.  CT CHEST WITH CONTRAST  Technique:  Multidetector CT imaging of the chest was performed following the standard protocol during bolus administration of intravenous contrast.  Contrast:  80 ml Omnipaque-300  Comparison: Chest radiographs obtained earlier today.  Findings: Patchy, confluent and mass-like areas of increased density scattered throughout both lungs.  Some of these areas are developing central cavitation.  These are involving all of the lobes in both lungs.  Mildly enlarged bilateral hilar lymph nodes.  These include a 1.5 x 1.0 cm left hilar lymph node on image number 32 and a 0.9 x 0.7 cm right hilar lymph node on image number 31.  Small left pleural effusion and minimal right pleural effusion. Unremarkable upper abdomen.  IMPRESSION:  1.  Extensive bilateral cavitary pneumonia. Differential considerations include Staphylococcus, Klebsiella and fungal pneumonia.  This is not a typical distribution for tuberculosis, but this needs to be considered. 2.  Mild bilateral hilar adenopathy.  Original Report Authenticated By: Darrol Angel, M.D.   US Abdomen Complete  06/14/2011  *RADIOLOGY REPORT*  Clinical Data:  Increased LFTs  ABDOMINAL ULTRASOUND COMPLETE  Comparison:  Chest CT 06/13/2011  Findings:  Gallbladder:  No gallstones, gallbladder wall thickening, or pericholecystic fluid. Sonographic Murphy's sign is negative.  Common Bile Duct:  Within normal limits in caliber.  Liver: No focal mass lesion identified.  Within normal limits in parenchymal echogenicity.  IVC:  Appears normal.  Pancreas:  No abnormality identified.  Spleen:  Within normal limits in size and echotexture.  Right kidney:  Normal in size and parenchymal echogenicity.  No evidence of mass or hydronephrosis.  Left kidney:  Normal in size and parenchymal echogenicity.  No evidence of mass or hydronephrosis.  Abdominal Aorta:  No aneurysm  identified.  A left pleural effusion is noted.  IMPRESSION:  1.  Negative abdominal ultrasound. 2.  Left pleural effusion visualized.  Original Report Authenticated By: Britta Mccreedy, M.D.   Dg Chest Port 1 View  06/12/2011  *RADIOLOGY REPORT*  Clinical Data: Pneumonia.  PORTABLE CHEST - 1 VIEW  Comparison: 06/10/2011  Findings: Patchy bilateral airspace disease again noted, unchanged. There is consolidation in both lower lobes and upper lobes. Suspect trace effusions.  IMPRESSION: No significant change.  Original Report Authenticated By: Cyndie Chime, M.D.   Dg Chest Port 1 View  06/10/2011  *RADIOLOGY REPORT*  Clinical Data: Pneumonia follow up.  PORTABLE CHEST - 1 VIEW  Comparison: 06/09/2011.  Findings: Progressive bilateral patchy consolidation consistent with multifocal pneumonia possibly with associated atelectasis in the lung bases.  Recommend follow-up until complete clearance to exclude underlying mass.  No gross pneumothorax.  Central pulmonary vascular prominence.  Heart size top normal.  IMPRESSION: Progressive bilateral multifocal pneumonia suspected as noted above.  Original Report Authenticated By: Fuller Canada, M.D.    Antibiotics:  Anti-infectives     Start     Dose/Rate Route Frequency Ordered Stop   06/16/11 1400   metroNIDAZOLE (FLAGYL) tablet 500 mg        500 mg Oral 3 times per day 06/16/11 1110     06/15/11 2200   linezolid (ZYVOX) tablet 600 mg        600 mg Oral Every 12 hours 06/15/11 1856     06/15/11 0000  Ampicillin-Sulbactam (UNASYN) 3 g in sodium chloride 0.9 % 100 mL IVPB       3 g 100  mL/hr over 60 Minutes Intravenous Every 6 hours 06/14/11 1644     06/14/11 1800   moxifloxacin (AVELOX) tablet 400 mg  Status:  Discontinued        400 mg Oral Daily-1800 06/13/11 1445 06/13/11 1721   06/14/11 1730   Ampicillin-Sulbactam (UNASYN) 3 g in sodium chloride 0.9 % 100 mL IVPB  Status:  Discontinued        3 g 100 mL/hr over 60 Minutes Intravenous  Once  06/14/11 1644 06/16/11 1109   06/14/11 1200   linezolid (ZYVOX) IVPB 600 mg  Status:  Discontinued        600 mg 300 mL/hr over 60 Minutes Intravenous 2 times daily 06/14/11 1042 06/15/11 1856   06/14/11 1100   piperacillin-tazobactam (ZOSYN) IVPB 3.375 g  Status:  Discontinued        3.375 g 12.5 mL/hr over 240 Minutes Intravenous Every 8 hours 06/14/11 0923 06/14/11 1622   06/14/11 0930   vancomycin (VANCOCIN) IVPB 1000 mg/200 mL premix  Status:  Discontinued        1,000 mg 200 mL/hr over 60 Minutes Intravenous Every 8 hours 06/14/11 0917 06/14/11 1042   06/14/11 0930   piperacillin-tazobactam (ZOSYN) IVPB 3.375 g  Status:  Discontinued        3.375 g 100 mL/hr over 30 Minutes Intravenous Every 8 hours 06/14/11 0919 06/14/11 0921   06/14/11 0000   cefTRIAXone (ROCEPHIN) 1 g in dextrose 5 % 50 mL IVPB  Status:  Discontinued        1 g 100 mL/hr over 30 Minutes Intravenous Every 24 hours 06/13/11 1725 06/14/11 0840   06/14/11 0000   azithromycin (ZITHROMAX) 500 mg in dextrose 5 % 250 mL IVPB  Status:  Discontinued        500 mg 250 mL/hr over 60 Minutes Intravenous Every 24 hours 06/13/11 1725 06/14/11 0837   06/10/11 1430   vancomycin (VANCOCIN) IVPB 1000 mg/200 mL premix  Status:  Discontinued        1,000 mg 200 mL/hr over 60 Minutes Intravenous Every 8 hours 06/10/11 1358 06/11/11 1925   06/10/11 1000   oseltamivir (TAMIFLU) capsule 75 mg  Status:  Discontinued     Comments: FOR FIVE DAYS      75 mg Oral 2 times daily 06/10/11 0128 06/16/11 0952   06/09/11 2115   cefTRIAXone (ROCEPHIN) 1 g in dextrose 5 % 50 mL IVPB  Status:  Discontinued        1 g 100 mL/hr over 30 Minutes Intravenous Every 24 hours 06/09/11 2110 06/13/11 1718   06/09/11 2115   azithromycin (ZITHROMAX) 500 mg in dextrose 5 % 250 mL IVPB  Status:  Discontinued        500 mg 250 mL/hr over 60 Minutes Intravenous Every 24 hours 06/09/11 2110 06/13/11 1718          Medications: Scheduled Meds:     . ampicillin-sulbactam (UNASYN) IV  3 g Intravenous Q6H  . enoxaparin (LOVENOX) injection  40 mg Subcutaneous Q24H  . linezolid  600 mg Oral Q12H  . metroNIDAZOLE  500 mg Oral Q8H  . potassium chloride  40 mEq Oral BID  . DISCONTD: ampicillin-sulbactam (UNASYN) IV  3 g Intravenous Once  . DISCONTD: linezolid  600 mg Intravenous BID  . DISCONTD: oseltamivir  75 mg Oral BID   Continuous Infusions:  PRN Meds:.alum & mag hydroxide-simeth, ibuprofen, levalbuterol, ondansetron (ZOFRAN) IV, ondansetron  Assessment/Plan: Nathaniel Zuniga is a 35 y.o. male  with hx of tobacco, marijuana use and heavy etoh use who has a multifocal cavitary pneumonia that has not responded to typical CAP coverage    1) Cavitary PNeumonia:   Differential includes MRSA/MSSA pneumonia in context of influenza infection or post influenza. Certainly an progressive aspiration pneumonia that could have festered after his losing consciousness is possible. Finally we need to consider possibility of TB and of fungal infection. Interestingly [place] and African Americans have increase risk for infection with coccidioides inmitus and this patient has exposure hx with hx of [place] in [place]. AFB sputum negative x 1 --change to po zyvox and flagyl --collect AFB culture x more -- fu/ check quantiferon gold  --if he is still febrile and not responding to above mentioned antibiotics then he needs bronchoscopy for BAL for AFB, fungal organisms, quantitative cultures, nocardia  ---agree with finish 5 days of tamiflu  IP:  --continue airborne precautions, EVEN if AFB smears are negative until we are completely convinced he is doing well enough clinically to definitively avoid bronchoscopy. IfI rule him out would downgrade to droplet precatuions for flu given possibilyt that his flu pcr could have been falsey negative    LOS: 7 days   Acey Lav 06/16/2011, 11:10 AM

## 2011-06-16 NOTE — Progress Notes (Addendum)
HISTORY of PRESENT ILLNESS: Nathaniel Zuniga is a 35 y.o. male smoker admitted on 06/09/2011 with multi-focal pulmonary infiltrates, fever, hypoxemia.  His symptoms started 11/22.  His twin brother was recently dx with flu.  Found to have cavitary b/l infiltrates on CT chest.  Cultures: 11/30 Flu A / B>>>negative 11/30 H1N1>>>negative 11/30 MRSA PCR>>>negative 11/30 HIV>>>Nonreactive 11/30 BCx2>>>negative 11/30 UA>>>negative 12/02 BCx2>> 12/04 Sputum>>negative 12/04 Sputum AFB>>smear negative>>  Abx: 11/29 Rocephin>>>12/03 11/29 Zithromax>>>12/03 11/29 Tamiflu>>>12/06 11/30 Vanc>>12/1 12/03 Zosyn>>12/04 12/03 Vancomycin>>12/04 12/04 Linezolid>> 12/04 Unasyn>>  Tests: 12/03 CT chest>>patchy, confluent mass like densities b/l with central cavitation, Lt hilar node 1.5 x 1 cm, Rt hilar node 0.9 x 0.7 cm, small b/l effusions 12/03>>ESR 110, RF 13, ANA negative, anti-CCP < 2, ANCA negative  Subjective Tm 100.8.  Only two doses of motrin yesterday>>none given today.  Feels better, but still fatigued.  Blood pressure 130/78, pulse 76, temperature 98.8 F (37.1 C), temperature source Oral, resp. rate 20, height 5\' 6"  (1.676 m), weight 162 lb 0.6 oz (73.5 kg), SpO2 97.00%.  PHYICAL EXAM:  General - no distress HEENT - no sinus tenderness Cardiac - s1s2 regular, no murmur Chest - bronchial breath sounds b/l with rales Lt base Abd - soft, non tender Ext - no edema Neuro - normal strength Psych - normal mood, behavior  LABS BMET    Component Value Date/Time   NA 133* 06/15/2011 0640   K 4.8 06/15/2011 0640   CL 98 06/15/2011 0640   CO2 24 06/15/2011 0640   GLUCOSE 116* 06/15/2011 0640   BUN 8 06/15/2011 0640   CREATININE 0.59 06/15/2011 0640   CALCIUM 8.4 06/15/2011 0640   GFRNONAA >90 06/15/2011 0640   GFRAA >90 06/15/2011 0640   Lab Results  Component Value Date   ALT 814* 06/15/2011   AST 456* 06/15/2011   ALKPHOS 153* 06/15/2011   BILITOT 0.4 06/15/2011    CBC    Component  Value Date/Time   WBC 21.4* 06/16/2011 0604   RBC 4.02* 06/16/2011 0604   HGB 12.5* 06/16/2011 0604   HCT 36.2* 06/16/2011 0604   PLT 414* 06/16/2011 0604   MCV 90.0 06/16/2011 0604   MCH 31.1 06/16/2011 0604   MCHC 34.5 06/16/2011 0604   RDW 13.5 06/16/2011 0604   LYMPHSABS 1.6 06/13/2011 0920   MONOABS 3.6* 06/13/2011 0920   EOSABS 0.0 06/13/2011 0920   BASOSABS 0.2* 06/13/2011 0920    Dg Chest 2 View  06/16/2011  *RADIOLOGY REPORT*  Clinical Data: Shortness of breath, cough  CHEST - 2 VIEW  Comparison: Chest x-ray of 06/13/2011 and CT chest of the same date  Findings: There is little change to perhaps very minimal improvement in patchy airspace disease bilaterally which appears to be due to cavitary pneumonias by CT.  A small left pleural effusion is noted which may be partially loculated.  The heart is mildly enlarged.  IMPRESSION: Little change to slight improvement in patchy airspace disease bilaterally with left pleural effusion.  Original Report Authenticated By: Juline Patch, M.D.   US Abdomen Complete  06/14/2011  *RADIOLOGY REPORT*  Clinical Data:  Increased LFTs  ABDOMINAL ULTRASOUND COMPLETE  Comparison:  Chest CT 06/13/2011  Findings:  Gallbladder:  No gallstones, gallbladder wall thickening, or pericholecystic fluid. Sonographic Murphy's sign is negative.  Common Bile Duct:  Within normal limits in caliber.  Liver: No focal mass lesion identified.  Within normal limits in parenchymal echogenicity.  IVC:  Appears normal.  Pancreas:  No abnormality identified.  Spleen:  Within normal limits in size and echotexture.  Right kidney:  Normal in size and parenchymal echogenicity.  No evidence of mass or hydronephrosis.  Left kidney:  Normal in size and parenchymal echogenicity.  No evidence of mass or hydronephrosis.  Abdominal Aorta:  No aneurysm identified.  A left pleural effusion is noted.  IMPRESSION:  1.  Negative abdominal ultrasound. 2.  Left pleural effusion visualized.  Original Report  Authenticated By: Britta Mccreedy, M.D.      ASSESSMENT/PLAN:  B/L pulmonary infiltrates with fever -CT chest shows cavitary/necrotizing lesions -likely bacterial pneumonia -doubt tuberculosis>>f/u sputum for AFB -continue linezolid>>?if can be changed to po -continue unasyn -nebs to prn -f/u CXR 12/06 -if no improvement may need bronchoscopy>>dont think he needs this at present -finish 5 days of tamiflu per ID>>will d/c 12/06  ?Lt pleural effusion -will assess with u/s and then decide if thoracentesis needed  Hypoxemia -titrate oxygen to keep SpO2 > 92%  Tobacco abuse -will need further education about smoking cessation  Pain, fever -motrin prn  Elevated LFT -f/u LFT -avoid tylenol  Fever curve improved, chest xray improving, and clinically improving with current therapy.  Don't think bronchoscopy is needed at this time.  Will f/u AFB smear, and if negative d/c airborne isolation.  He will still need droplet isolation due to concern for influenza per ID.  SOOD,VINEET 06/16/2011, 9:49 AM Pager:  [phone]    11:07 AM Addendum:  Pleural space assessed with ultrasound - no fluid on R.  Minimal fluid on left - insufficient amount for thoracentesis.    Canary Brim, NP-C [facility] Pgr: [phone]

## 2011-06-16 NOTE — Progress Notes (Signed)
Subjective:  S: still spiking fevers overnight, persistent leukocytosis, continues to cough.  Objective: Weight change:   Intake/Output Summary (Last 24 hours) at 06/16/11 0819 Last data filed at 06/16/11 0600  Gross per 24 hour  Intake    580 ml  Output    300 ml  Net    280 ml  Blood pressure 137/81, pulse 76, temperature 99.1 F (37.3 C), temperature source Oral, resp. rate 22, height 5\' 6"  (1.676 m), weight 73.5 kg (162 lb 0.6 oz), SpO2 94.00%.  GEN: Alert and oriented x3 not in acute distress Cvs:S1 and S2 heard.  Respiratory exam : scattered rales, no wheezing heard  Abdomen: soft, non tender, non distended bowel sounds are heard  Extremities: no pedal edema. Cyanosis and clubbing. Neurological exam patient is alert oriented x3 , nonfocal   Lab Results: Results for orders placed during the hospital encounter of 06/09/11 (from the past 24 hour(s))  CBC     Status: Abnormal   Collection Time   06/16/11  6:04 AM      Component Value Range   WBC 21.4 (*) 4.0 - 10.5 (K/uL)   RBC 4.02 (*) 4.22 - 5.81 (MIL/uL)   Hemoglobin 12.5 (*) 13.0 - 17.0 (g/dL)   HCT 45.4 (*) 09.8 - 52.0 (%)   MCV 90.0  78.0 - 100.0 (fL)   MCH 31.1  26.0 - 34.0 (pg)   MCHC 34.5  30.0 - 36.0 (g/dL)   RDW 11.9  14.7 - 82.9 (%)   Platelets 414 (*) 150 - 400 (K/uL)     Micro Results: Recent Results (from the past 240 hour(s))  CULTURE, BLOOD (ROUTINE X 2)     Status: Normal (Preliminary result)   Collection Time   06/10/11  2:12 AM      Component Value Range Status Comment   Specimen Description BLOOD RIGHT FOREARM   Final    Special Requests BOTTLES DRAWN AEROBIC AND ANAEROBIC 5CC EACH   Final    Setup Time [id number]   Final    Culture     Final    Value:        BLOOD CULTURE RECEIVED NO GROWTH TO DATE CULTURE WILL BE HELD FOR 5 DAYS BEFORE ISSUING A FINAL NEGATIVE REPORT   Report Status PENDING   Incomplete   CULTURE, BLOOD (ROUTINE X 2)     Status: Normal (Preliminary result)   Collection  Time   06/10/11  2:45 AM      Component Value Range Status Comment   Specimen Description BLOOD RIGHT ARM   Final    Special Requests BOTTLES DRAWN AEROBIC AND ANAEROBIC 10CC EACH   Final    Setup Time [id number]   Final    Culture     Final    Value:        BLOOD CULTURE RECEIVED NO GROWTH TO DATE CULTURE WILL BE HELD FOR 5 DAYS BEFORE ISSUING A FINAL NEGATIVE REPORT   Report Status PENDING   Incomplete   MRSA PCR SCREENING     Status: Normal   Collection Time   06/10/11  3:27 AM      Component Value Range Status Comment   MRSA by PCR NEGATIVE  NEGATIVE  Final   CULTURE, BLOOD (ROUTINE X 2)     Status: Normal (Preliminary result)   Collection Time   06/12/11  4:50 PM      Component Value Range Status Comment   Specimen Description BLOOD LEFT ARM   Final  Special Requests BOTTLES DRAWN AEROBIC AND ANAEROBIC 10CC   Final    Setup Time [id number]   Final    Culture     Final    Value:        BLOOD CULTURE RECEIVED NO GROWTH TO DATE CULTURE WILL BE HELD FOR 5 DAYS BEFORE ISSUING A FINAL NEGATIVE REPORT   Report Status PENDING   Incomplete   CULTURE, BLOOD (ROUTINE X 2)     Status: Normal (Preliminary result)   Collection Time   06/12/11  5:00 PM      Component Value Range Status Comment   Specimen Description BLOOD LEFT WRIST   Final    Special Requests BOTTLES DRAWN AEROBIC AND ANAEROBIC 10CC   Final    Setup Time [id number]   Final    Culture     Final    Value:        BLOOD CULTURE RECEIVED NO GROWTH TO DATE CULTURE WILL BE HELD FOR 5 DAYS BEFORE ISSUING A FINAL NEGATIVE REPORT   Report Status PENDING   Incomplete   CULTURE, SPUTUM-ASSESSMENT     Status: Normal   Collection Time   06/14/11 12:33 PM      Component Value Range Status Comment   Specimen Description SPUTUM   Final    Special Requests NONE   Final    Sputum evaluation     Final    Value: THIS SPECIMEN IS ACCEPTABLE. RESPIRATORY CULTURE REPORT TO FOLLOW.   Report Status 06/14/2011 FINAL   Final   AFB CULTURE  WITH SMEAR     Status: Normal (Preliminary result)   Collection Time   06/14/11 12:33 PM      Component Value Range Status Comment   Specimen Description SPUTUM   Final    Special Requests NONE   Final    ACID FAST SMEAR NO ACID FAST BACILLI SEEN   Final    Culture     Final    Value: CULTURE WILL BE EXAMINED FOR 6 WEEKS BEFORE ISSUING A FINAL REPORT   Report Status PENDING   Incomplete   CULTURE, RESPIRATORY     Status: Normal   Collection Time   06/14/11 12:33 PM      Component Value Range Status Comment   Specimen Description SPUTUM   Final    Special Requests NONE   Final    Gram Stain     Final    Value: ABUNDANT WBC PRESENT,BOTH PMN AND MONONUCLEAR     RARE SQUAMOUS EPITHELIAL CELLS PRESENT     RARE GRAM POSITIVE COCCI     IN PAIRS   Culture NORMAL OROPHARYNGEAL FLORA   Final    Report Status 06/16/2011 FINAL   Final     Studies/Results: Dg Chest 2 View  06/13/2011  *RADIOLOGY REPORT*  Clinical Data: Shortness of breath.  Cough.  Fever.  CHEST - 2 VIEW  Comparison: The exams back to 06/09/2011  Findings: Patchy asymmetric airspace disease seen bilaterally shows no substantial interval change since yesterday's film, but is progressed since 06/10/2011. Cardiopericardial silhouette is at upper limits of normal for size.  IMPRESSION: Patchy asymmetric airspace disease without substantial change since yesterday's film.  Original Report Authenticated By: ERIC A. MANSELL, M.D.   Dg Chest 2 View  06/09/2011  *RADIOLOGY REPORT*  Clinical Data: Cough, congestion, fever and shortness of breath.  CHEST - 2 VIEW  Comparison: None.  Findings: Multifocal infiltrates noted involving the upper and lower lung zones bilaterally.  Some  of these areas are nodular in appearance and follow-up chest x-ray recommended.  There likely are tiny bilateral pleural effusions.  No edema.  Heart size and mediastinal contours are within normal limits.  IMPRESSION: Multifocal bilateral pneumonia.  Original Report  Authenticated By: Reola Calkins, M.D.   Ct Chest W Contrast  06/13/2011  *RADIOLOGY REPORT*  Clinical Data: Cough, fever, pneumonia.  Progressive lung infiltrates.  CT CHEST WITH CONTRAST  Technique:  Multidetector CT imaging of the chest was performed following the standard protocol during bolus administration of intravenous contrast.  Contrast:  80 ml Omnipaque-300  Comparison: Chest radiographs obtained earlier today.  Findings: Patchy, confluent and mass-like areas of increased density scattered throughout both lungs.  Some of these areas are developing central cavitation.  These are involving all of the lobes in both lungs.  Mildly enlarged bilateral hilar lymph nodes.  These include a 1.5 x 1.0 cm left hilar lymph node on image number 32 and a 0.9 x 0.7 cm right hilar lymph node on image number 31.  Small left pleural effusion and minimal right pleural effusion. Unremarkable upper abdomen.  IMPRESSION:  1.  Extensive bilateral cavitary pneumonia. Differential considerations include Staphylococcus, Klebsiella and fungal pneumonia.  This is not a typical distribution for tuberculosis, but this needs to be considered. 2.  Mild bilateral hilar adenopathy.  Original Report Authenticated By: Darrol Angel, M.D.   Dg Chest Port 1 View  06/12/2011  *RADIOLOGY REPORT*  Clinical Data: Pneumonia.  PORTABLE CHEST - 1 VIEW  Comparison: 06/10/2011  Findings: Patchy bilateral airspace disease again noted, unchanged. There is consolidation in both lower lobes and upper lobes. Suspect trace effusions.  IMPRESSION: No significant change.  Original Report Authenticated By: Cyndie Chime, M.D.   Dg Chest Port 1 View  06/10/2011  *RADIOLOGY REPORT*  Clinical Data: Pneumonia follow up.  PORTABLE CHEST - 1 VIEW  Comparison: 06/09/2011.  Findings: Progressive bilateral patchy consolidation consistent with multifocal pneumonia possibly with associated atelectasis in the lung bases.  Recommend follow-up until complete  clearance to exclude underlying mass.  No gross pneumothorax.  Central pulmonary vascular prominence.  Heart size top normal.  IMPRESSION: Progressive bilateral multifocal pneumonia suspected as noted above.  Original Report Authenticated By: Fuller Canada, M.D.   Medications: Scheduled Meds:    . ampicillin-sulbactam (UNASYN) IV  3 g Intravenous Once  . ampicillin-sulbactam (UNASYN) IV  3 g Intravenous Q6H  . enoxaparin (LOVENOX) injection  40 mg Subcutaneous Q24H  . linezolid  600 mg Oral Q12H  . oseltamivir  75 mg Oral BID  . potassium chloride  40 mEq Oral BID  . DISCONTD: linezolid  600 mg Intravenous BID   Continuous Infusions:  PRN Meds:.alum & mag hydroxide-simeth, ibuprofen, levalbuterol, ondansetron (ZOFRAN) IV, ondansetron  Assessment/Plan:   Acute hypoxic respiratory failure/bilateral cavitary pneumonia: AFB negative - on Unasyn and Zyvox, Tamiflu,  Nasal oxygen, nebulizers as needed, - Blood cultures negative so for, influenza negative, AFB neg, pulmonary and infectious disease following  - quantiferon gold pending, not responding to the antibiotics, still spiking temps with peristent leukocytosis, d/w Pulmonology (Dr Craige Cotta), bronch today.  Elevated liver function tests : secondary to disease process.  - Hepatitis serologies pending, ultrasound of the abdomen normal, repeat LFT's   Tobacco abuse tobacco cessation counseling given.  Discussed with Dr. Coralyn Helling (Pulmnology), rec'd NPO for bronch today, transfer patient to [facility] service under Dr Craige Cotta. I will sign off.   LOS: 7 days   Nathaniel Zuniga 06/16/2011,  8:19 AM

## 2011-06-17 DIAGNOSIS — J189 Pneumonia, unspecified organism: Secondary | ICD-10-CM

## 2011-06-17 DIAGNOSIS — R748 Abnormal levels of other serum enzymes: Secondary | ICD-10-CM | POA: Diagnosis not present

## 2011-06-17 LAB — CBC
MCH: 31.4 pg (ref 26.0–34.0)
MCV: 90.3 fL (ref 78.0–100.0)
Platelets: 463 10*3/uL — ABNORMAL HIGH (ref 150–400)
RBC: 4.11 MIL/uL — ABNORMAL LOW (ref 4.22–5.81)
RDW: 13.6 % (ref 11.5–15.5)

## 2011-06-17 LAB — AFB CULTURE WITH SMEAR (NOT AT ARMC): Acid Fast Smear: NONE SEEN

## 2011-06-17 LAB — COMPREHENSIVE METABOLIC PANEL
AST: 169 U/L — ABNORMAL HIGH (ref 0–37)
Albumin: 2.1 g/dL — ABNORMAL LOW (ref 3.5–5.2)
Alkaline Phosphatase: 166 U/L — ABNORMAL HIGH (ref 39–117)
Chloride: 99 mEq/L (ref 96–112)
Potassium: 4.4 mEq/L (ref 3.5–5.1)
Sodium: 135 mEq/L (ref 135–145)
Total Bilirubin: 0.3 mg/dL (ref 0.3–1.2)

## 2011-06-17 NOTE — Progress Notes (Signed)
HISTORY of PRESENT ILLNESS: Nathaniel Zuniga is a 35 y.o. male smoker admitted on 06/09/2011 with multi-focal pulmonary infiltrates, fever, hypoxemia.  His symptoms started 11/22.  His twin brother was recently dx with flu.  Found to have cavitary b/l infiltrates on CT chest.  Cultures: 11/30 Flu A / B>>>negative 11/30 H1N1>>>negative 11/30 MRSA PCR>>>negative 11/30 HIV>>>Nonreactive 11/30 BCx2>>>negative 11/30 UA>>>negative 12/02 BCx2>> 12/04 Sputum>>negative 12/04 Sputum AFB>>smear negative>> 12/06 Sputum AFB>>  Abx: 11/29 Rocephin>>>12/03 11/29 Zithromax>>>12/03 11/29 Tamiflu>>>12/06 11/30 Vanc>>12/1 12/03 Zosyn>>12/04 12/03 Vancomycin>>12/04 12/04 Linezolid>> 12/04 Unasyn>>12/06 12/06 Flagyl>>  Tests: 12/03 CT chest>>patchy, confluent mass like densities b/l with central cavitation, Lt hilar node 1.5 x 1 cm, Rt hilar node 0.9 x 0.7 cm, small b/l effusions 12/03>>ESR 110, RF 13, ANA negative, anti-CCP < 2, ANCA negative 12/04>>Hep A IgM, Hep B surface Ag, Hep C IgM, HCV Ab all negative 12/04>>Quantiferon Gold assay indeterminate 12/05>>Quantiferon Gold assay negative  Subjective Tm 98.7.  Only one doses of motrin yesterday>>none given today.  Feels better, decreased sweats and chest pain.  Decreased cough and sputum.  Blood pressure 127/84, pulse 78, temperature 98.6 F (37 C), temperature source Oral, resp. rate 20, height 5\' 6"  (1.676 m), weight 162 lb 0.6 oz (73.5 kg), SpO2 96.00%.  PHYICAL EXAM:  General - no distress HEENT - no sinus tenderness Cardiac - s1s2 regular, no murmur Chest - bronchial breath sounds b/l with rales Lt base Abd - soft, non tender Ext - no edema Neuro - normal strength Psych - normal mood, behavior  LABS BMET    Component Value Date/Time   NA 135 06/17/2011 0645   K 4.4 06/17/2011 0645   CL 99 06/17/2011 0645   CO2 26 06/17/2011 0645   GLUCOSE 108* 06/17/2011 0645   BUN 11 06/17/2011 0645   CREATININE 0.72 06/17/2011 0645   CALCIUM 8.4  06/17/2011 0645   GFRNONAA >90 06/17/2011 0645   GFRAA >90 06/17/2011 0645   Lab Results  Component Value Date   ALT 606* 06/17/2011   AST 169* 06/17/2011   ALKPHOS 166* 06/17/2011   BILITOT 0.3 06/17/2011    CBC    Component Value Date/Time   WBC 16.7* 06/17/2011 0645   RBC 4.11* 06/17/2011 0645   HGB 12.9* 06/17/2011 0645   HCT 37.1* 06/17/2011 0645   PLT 463* 06/17/2011 0645   MCV 90.3 06/17/2011 0645   MCH 31.4 06/17/2011 0645   MCHC 34.8 06/17/2011 0645   RDW 13.6 06/17/2011 0645   LYMPHSABS 1.6 06/13/2011 0920   MONOABS 3.6* 06/13/2011 0920   EOSABS 0.0 06/13/2011 0920   BASOSABS 0.2* 06/13/2011 0920    Dg Chest 2 View  06/16/2011  *RADIOLOGY REPORT*  Clinical Data: Shortness of breath, cough  CHEST - 2 VIEW  Comparison: Chest x-ray of 06/13/2011 and CT chest of the same date  Findings: There is little change to perhaps very minimal improvement in patchy airspace disease bilaterally which appears to be due to cavitary pneumonias by CT.  A small left pleural effusion is noted which may be partially loculated.  The heart is mildly enlarged.  IMPRESSION: Little change to slight improvement in patchy airspace disease bilaterally with left pleural effusion.  Original Report Authenticated By: Juline Patch, M.D.      ASSESSMENT/PLAN:  B/L pulmonary infiltrates with fever -CT chest shows cavitary/necrotizing lesions -likely bacterial pneumonia -doubt tuberculosis>>f/u sputum for AFB -continue linezolid>>changed to po 12/06 -unasyn d/c'ed 12/06, and flagyl started -nebs to prn -f/u CXR 12/08 -if no improvement  may need bronchoscopy>>dont think he needs this at present -completed course of tamiflu  Lt pleural effusion -small by u/s analysis 12/06 -f/u CXR 12/08  Hypoxemia -titrate oxygen to keep SpO2 > 92%  Tobacco abuse -will need further education about smoking cessation  Pain, fever -motrin prn>>has decreased need for motrin  Elevated LFT -trending down -viral hepatitis  panel negative -f/u LFT -avoid tylenol  Fever curve improved, chest xray improving, and clinically improving with current therapy.  Don't think bronchoscopy is needed at this time.  Will f/u AFB smear, and if negative d/c airborne isolation.  He will still need droplet isolation due to concern for influenza per ID.    May be able to d/c home soon if AFB smears negative and he remains afebrile.  Rexanne Inocencio 06/17/2011, 8:49 AM Pager:  [phone]

## 2011-06-17 NOTE — Progress Notes (Signed)
Subjective: Feels better  Objective: Weight change:   Intake/Output Summary (Last 24 hours) at 06/17/11 2052 Last data filed at 06/17/11 0600  Gross per 24 hour  Intake    240 ml  Output    300 ml  Net    -60 ml   Blood pressure 147/79, pulse 73, temperature 98.5 F (36.9 C), temperature source Oral, resp. rate 20, height 5\' 6"  (1.676 m), weight 162 lb 0.6 oz (73.5 kg), SpO2 92.00%. Temp:  [98.5 F (36.9 C)-98.7 F (37.1 C)] 98.5 F (36.9 C) (12/07 1659) Pulse Rate:  [73-91] 73  (12/07 1659) Resp:  [20] 20  (12/07 1659) BP: (127-147)/(79-84) 147/79 mmHg (12/07 1659) SpO2:  [92 %-96 %] 92 % (12/07 1659)  Physical Exam: Generoal : alert oriented,  HEENT: anicteric sclera, pupils reactive to light and accommodation, EOMI, oropharynx clear and without exudate  CVS regular rate, normal r, no murmur rubs or gallops  Chest:rackles at bases biltarerally, no wheezing Abdomen: soft nontender, nondistended, normal bowel sounds,  Extremities: no clubbing or edema noted bilaterally  Skin: no rashes  Neuro: nonfocal, strength and sensation intact   Lab Results:  Basename 06/17/11 0645 06/16/11 0604  WBC 16.7* 21.4*  HGB 12.9* 12.5*  HCT 37.1* 36.2*  PLT 463* 414*   BMET  Basename 06/17/11 0645 06/15/11 0640  NA 135 133*  K 4.4 4.8  CL 99 98  CO2 26 24  GLUCOSE 108* 116*  BUN 11 8  CREATININE 0.72 0.59  CALCIUM 8.4 8.4    Micro Results: Recent Results (from the past 240 hour(s))  CULTURE, BLOOD (ROUTINE X 2)     Status: Normal   Collection Time   06/10/11  2:12 AM      Component Value Range Status Comment   Specimen Description BLOOD RIGHT FOREARM   Final    Special Requests BOTTLES DRAWN AEROBIC AND ANAEROBIC [facility] EACH   Final    Setup Time [id number]   Final    Culture NO GROWTH 5 DAYS   Final    Report Status 06/16/2011 FINAL   Final   CULTURE, BLOOD (ROUTINE X 2)     Status: Normal   Collection Time   06/10/11  2:45 AM      Component Value Range Status  Comment   Specimen Description BLOOD RIGHT ARM   Final    Special Requests BOTTLES DRAWN AEROBIC AND ANAEROBIC 10CC EACH   Final    Setup Time [id number]   Final    Culture NO GROWTH 5 DAYS   Final    Report Status 06/16/2011 FINAL   Final   MRSA PCR SCREENING     Status: Normal   Collection Time   06/10/11  3:27 AM      Component Value Range Status Comment   MRSA by PCR NEGATIVE  NEGATIVE  Final   CULTURE, BLOOD (ROUTINE X 2)     Status: Normal (Preliminary result)   Collection Time   06/12/11  4:50 PM      Component Value Range Status Comment   Specimen Description BLOOD LEFT ARM   Final    Special Requests BOTTLES DRAWN AEROBIC AND ANAEROBIC 10CC   Final    Setup Time [id number]   Final    Culture     Final    Value:        BLOOD CULTURE RECEIVED NO GROWTH TO DATE CULTURE WILL BE HELD FOR 5 DAYS BEFORE ISSUING A FINAL NEGATIVE REPORT   Report  Status PENDING   Incomplete   CULTURE, BLOOD (ROUTINE X 2)     Status: Normal (Preliminary result)   Collection Time   06/12/11  5:00 PM      Component Value Range Status Comment   Specimen Description BLOOD LEFT WRIST   Final    Special Requests BOTTLES DRAWN AEROBIC AND ANAEROBIC 10CC   Final    Setup Time [id number]   Final    Culture     Final    Value:        BLOOD CULTURE RECEIVED NO GROWTH TO DATE CULTURE WILL BE HELD FOR 5 DAYS BEFORE ISSUING A FINAL NEGATIVE REPORT   Report Status PENDING   Incomplete   CULTURE, SPUTUM-ASSESSMENT     Status: Normal   Collection Time   06/14/11 12:33 PM      Component Value Range Status Comment   Specimen Description SPUTUM   Final    Special Requests NONE   Final    Sputum evaluation     Final    Value: THIS SPECIMEN IS ACCEPTABLE. RESPIRATORY CULTURE REPORT TO FOLLOW.   Report Status 06/14/2011 FINAL   Final   AFB CULTURE WITH SMEAR     Status: Normal (Preliminary result)   Collection Time   06/14/11 12:33 PM      Component Value Range Status Comment   Specimen Description SPUTUM    Final    Special Requests NONE   Final    ACID FAST SMEAR NO ACID FAST BACILLI SEEN   Final    Culture     Final    Value: CULTURE WILL BE EXAMINED FOR 6 WEEKS BEFORE ISSUING A FINAL REPORT   Report Status PENDING   Incomplete   CULTURE, RESPIRATORY     Status: Normal   Collection Time   06/14/11 12:33 PM      Component Value Range Status Comment   Specimen Description SPUTUM   Final    Special Requests NONE   Final    Gram Stain     Final    Value: ABUNDANT WBC PRESENT,BOTH PMN AND MONONUCLEAR     RARE SQUAMOUS EPITHELIAL CELLS PRESENT     RARE GRAM POSITIVE COCCI     IN PAIRS   Culture NORMAL OROPHARYNGEAL FLORA   Final    Report Status 06/16/2011 FINAL   Final   AFB CULTURE WITH SMEAR     Status: Normal (Preliminary result)   Collection Time   06/16/11  2:05 PM      Component Value Range Status Comment   Specimen Description SPUTUM   Final    Special Requests NONE   Final    ACID FAST SMEAR NO ACID FAST BACILLI SEEN   Final    Culture     Final    Value: CULTURE WILL BE EXAMINED FOR 6 WEEKS BEFORE ISSUING A FINAL REPORT   Report Status PENDING   Incomplete     Studies/Results: Dg Chest 2 View  06/13/2011  *RADIOLOGY REPORT*  Clinical Data: Shortness of breath.  Cough.  Fever.  CHEST - 2 VIEW  Comparison: The exams back to 06/09/2011  Findings: Patchy asymmetric airspace disease seen bilaterally shows no substantial interval change since yesterday's film, but is progressed since 06/10/2011. Cardiopericardial silhouette is at upper limits of normal for size.  IMPRESSION: Patchy asymmetric airspace disease without substantial change since yesterday's film.  Original Report Authenticated By: ERIC A. MANSELL, M.D.    Ct Chest W Contrast  06/13/2011  *  RADIOLOGY REPORT*  Clinical Data: Cough, fever, pneumonia.  Progressive lung infiltrates.  CT CHEST WITH CONTRAST  Technique:  Multidetector CT imaging of the chest was performed following the standard protocol during bolus  administration of intravenous contrast.  Contrast:  80 ml Omnipaque-300  Comparison: Chest radiographs obtained earlier today.  Findings: Patchy, confluent and mass-like areas of increased density scattered throughout both lungs.  Some of these areas are developing central cavitation.  These are involving all of the lobes in both lungs.  Mildly enlarged bilateral hilar lymph nodes.  These include a 1.5 x 1.0 cm left hilar lymph node on image number 32 and a 0.9 x 0.7 cm right hilar lymph node on image number 31.  Small left pleural effusion and minimal right pleural effusion. Unremarkable upper abdomen.  IMPRESSION:  1.  Extensive bilateral cavitary pneumonia. Differential considerations include Staphylococcus, Klebsiella and fungal pneumonia.  This is not a typical distribution for tuberculosis, but this needs to be considered. 2.  Mild bilateral hilar adenopathy.  Original Report Authenticated By: Darrol Angel, M.D.   US Abdomen Complete  06/14/2011  *RADIOLOGY REPORT*  Clinical Data:  Increased LFTs  ABDOMINAL ULTRASOUND COMPLETE  Comparison:  Chest CT 06/13/2011  Findings:  Gallbladder:  No gallstones, gallbladder wall thickening, or pericholecystic fluid. Sonographic Murphy's sign is negative.  Common Bile Duct:  Within normal limits in caliber.  Liver: No focal mass lesion identified.  Within normal limits in parenchymal echogenicity.  IVC:  Appears normal.  Pancreas:  No abnormality identified.  Spleen:  Within normal limits in size and echotexture.  Right kidney:  Normal in size and parenchymal echogenicity.  No evidence of mass or hydronephrosis.  Left kidney:  Normal in size and parenchymal echogenicity.  No evidence of mass or hydronephrosis.  Abdominal Aorta:  No aneurysm identified.  A left pleural effusion is noted.  IMPRESSION:  1.  Negative abdominal ultrasound. 2.  Left pleural effusion visualized.  Original Report Authenticated By     Antibiotics:  Anti-infectives     Start      Dose/Rate Route Frequency Ordered Stop   06/16/11 1400   metroNIDAZOLE (FLAGYL) tablet 500 mg        500 mg Oral 3 times per day 06/16/11 1110     06/15/11 2200   linezolid (ZYVOX) tablet 600 mg        600 mg Oral Every 12 hours 06/15/11 1856     06/15/11 0000   Ampicillin-Sulbactam (UNASYN) 3 g in sodium chloride 0.9 % 100 mL IVPB  Status:  Discontinued        3 g 100 mL/hr over 60 Minutes Intravenous Every 6 hours 06/14/11 1644 06/16/11 1224   06/14/11 1800   moxifloxacin (AVELOX) tablet 400 mg  Status:  Discontinued        400 mg Oral Daily-1800 06/13/11 1445 06/13/11 1721   06/14/11 1730   Ampicillin-Sulbactam (UNASYN) 3 g in sodium chloride 0.9 % 100 mL IVPB  Status:  Discontinued        3 g 100 mL/hr over 60 Minutes Intravenous  Once 06/14/11 1644 06/16/11 1109   06/14/11 1200   linezolid (ZYVOX) IVPB 600 mg  Status:  Discontinued        600 mg 300 mL/hr over 60 Minutes Intravenous 2 times daily 06/14/11 1042 06/15/11 1856   06/14/11 1100   piperacillin-tazobactam (ZOSYN) IVPB 3.375 g  Status:  Discontinued        3.375 g 12.5 mL/hr over 240  Minutes Intravenous Every 8 hours 06/14/11 0923 06/14/11 1622   06/14/11 0930   vancomycin (VANCOCIN) IVPB 1000 mg/200 mL premix  Status:  Discontinued        1,000 mg 200 mL/hr over 60 Minutes Intravenous Every 8 hours 06/14/11 0917 06/14/11 1042   06/14/11 0930   piperacillin-tazobactam (ZOSYN) IVPB 3.375 g  Status:  Discontinued        3.375 g 100 mL/hr over 30 Minutes Intravenous Every 8 hours 06/14/11 0919 06/14/11 0921   06/14/11 0000   cefTRIAXone (ROCEPHIN) 1 g in dextrose 5 % 50 mL IVPB  Status:  Discontinued        1 g 100 mL/hr over 30 Minutes Intravenous Every 24 hours 06/13/11 1725 06/14/11 0840   06/14/11 0000   azithromycin (ZITHROMAX) 500 mg in dextrose 5 % 250 mL IVPB  Status:  Discontinued        500 mg 250 mL/hr over 60 Minutes Intravenous Every 24 hours 06/13/11 1725 06/14/11 0837   06/10/11 1430   vancomycin  (VANCOCIN) IVPB 1000 mg/200 mL premix  Status:  Discontinued        1,000 mg 200 mL/hr over 60 Minutes Intravenous Every 8 hours 06/10/11 1358 06/11/11 1925   06/10/11 1000   oseltamivir (TAMIFLU) capsule 75 mg  Status:  Discontinued     Comments: FOR FIVE DAYS      75 mg Oral 2 times daily 06/10/11 0128 06/16/11 0952   06/09/11 2115   cefTRIAXone (ROCEPHIN) 1 g in dextrose 5 % 50 mL IVPB  Status:  Discontinued        1 g 100 mL/hr over 30 Minutes Intravenous Every 24 hours 06/09/11 2110 06/13/11 1718   06/09/11 2115   azithromycin (ZITHROMAX) 500 mg in dextrose 5 % 250 mL IVPB  Status:  Discontinued        500 mg 250 mL/hr over 60 Minutes Intravenous Every 24 hours 06/09/11 2110 06/13/11 1718          Medications: Scheduled Meds:    . enoxaparin (LOVENOX) injection  40 mg Subcutaneous Q24H  . linezolid  600 mg Oral Q12H  . metroNIDAZOLE  500 mg Oral Q8H   Continuous Infusions:  PRN Meds:.alum & mag hydroxide-simeth, ibuprofen, levalbuterol, ondansetron (ZOFRAN) IV, ondansetron  Assessment/Plan: Nathaniel Zuniga is a 35 y.o. male with hx of tobacco, marijuana use and heavy etoh use who has a multifocal cavitary pneumonia that has not responded to typical CAP coverage    1) Cavitary PNeumonia:   Differential includes MRSA/MSSA pneumonia in context of influenza infection or post influenza. Certainly an progressive aspiration pneumonia that could have festered after his losing consciousness is possible. Finally we need to consider possibility of TB and of fungal infection. 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 AFB sputum negative x 2. Qferon negative  --WHEN LAST AFB SMEAR IS NEGATIVE DC AIR[place] AND CHANGE TO DROPLET  --OK to continue zyvox and flagyl in house --as nears dc, would change to high dose TMP/SMX DS two tablets tid  and continue a total of 21 days anti-MRSA coverage --continue po flagyl, and consider 6 more days of flagyl  -  IP:  --IF 3rd AFB negative DC airborne, and change to droplet  Can plug into [facility] with ID clinic MD for fu in next 2 weeks     LOS: 8 days  Acey Lav 06/17/2011, 8:52 PM

## 2011-06-18 ENCOUNTER — Inpatient Hospital Stay (HOSPITAL_COMMUNITY): Payer: Self-pay

## 2011-06-18 LAB — CULTURE, BLOOD (ROUTINE X 2)
Culture  Setup Time: 201212022147
Culture  Setup Time: 201212022147

## 2011-06-18 LAB — CBC
MCH: 30.8 pg (ref 26.0–34.0)
MCHC: 34.1 g/dL (ref 30.0–36.0)
MCV: 90.1 fL (ref 78.0–100.0)
Platelets: 523 10*3/uL — ABNORMAL HIGH (ref 150–400)
RDW: 13.4 % (ref 11.5–15.5)
WBC: 15.3 10*3/uL — ABNORMAL HIGH (ref 4.0–10.5)

## 2011-06-18 LAB — COMPREHENSIVE METABOLIC PANEL
ALT: 516 U/L — ABNORMAL HIGH (ref 0–53)
Albumin: 2.3 g/dL — ABNORMAL LOW (ref 3.5–5.2)
Alkaline Phosphatase: 163 U/L — ABNORMAL HIGH (ref 39–117)
BUN: 10 mg/dL (ref 6–23)
Chloride: 95 mEq/L — ABNORMAL LOW (ref 96–112)
Potassium: 4.2 mEq/L (ref 3.5–5.1)
Total Bilirubin: 0.3 mg/dL (ref 0.3–1.2)

## 2011-06-18 NOTE — Progress Notes (Signed)
Subjective: Pt had low grade temp last night, but afebrile this am.  Feels he is not doing as well.  No increased wob.  Objective: Vital signs in last 24 hours: Blood pressure 117/79, pulse 85, temperature 100.5 F (38.1 C), temperature source Oral, resp. rate 19, height 5\' 6"  (1.676 m), weight 73.5 kg (162 lb 0.6 oz), SpO2 97.00%.  Intake/Output from previous day:     Physical Exam:   wd male in nad Chest totally clear Cor with rrr abd benign LE without edema or cyanosis Alert and oriented, moves all 4    Lab Results:  Basename 06/18/11 0710 06/17/11 0645 06/16/11 0604  WBC 15.3* 16.7* 21.4*  HGB 12.7* 12.9* 12.5*  HCT 37.2* 37.1* 36.2*  PLT 523* 463* 414*   BMET  Basename 06/18/11 0710 06/17/11 0645  NA 132* 135  K 4.2 4.4  CL 95* 99  CO2 24 26  GLUCOSE 106* 108*  BUN 10 11  CREATININE 0.73 0.72  CALCIUM 8.8 8.4    Studies/Results: Dg Chest 2 View  06/18/2011  *RADIOLOGY REPORT*  Clinical Data: Pneumonia.  Chest pain.  Short of breath.  CHEST - 2 VIEW  Comparison: 06/16/2011.  Findings: Improving aeration at the left costophrenic angle. Multifocal patchy airspace disease is present, with the little change aside from at the left costophrenic angle.  Small left pleural effusion which may be loculated.  Cardiopericardial silhouette appears within normal limits.  IMPRESSION: Improving aeration of the left lung base with multifocal pneumonia and small left pleural effusion.  Original Report Authenticated By: Andreas Newport, M.D.    Assessment/Plan: Patient Active Hospital Problem List:  Cavitary PNA:  ?etiology.  Workup negative so far.  He had low grade temp last night, and may need to go on iv again if recurs.  May also need FOB.  Would also consider whether this is inflamm rather than infectious.  If so, may need vats bx. Continue orals for now, but monitor fever curve. F/u sputum afb's.   Elevated liver enzymes (06/17/2011)   Assessment: LFT's trending down.       Barbaraann Share, M.D. 06/18/2011, 11:34 AM

## 2011-06-19 DIAGNOSIS — J189 Pneumonia, unspecified organism: Secondary | ICD-10-CM

## 2011-06-19 DIAGNOSIS — J9600 Acute respiratory failure, unspecified whether with hypoxia or hypercapnia: Secondary | ICD-10-CM

## 2011-06-19 NOTE — Progress Notes (Signed)
Subjective: No fever in last 36hrs.  Pt still coughing at times.   Objective: Vital signs in last 24 hours: Blood pressure 120/82, pulse 83, temperature 97.8 F (36.6 C), temperature source Oral, resp. rate 20, height 5\' 6"  (1.676 m), weight 73.5 kg (162 lb 0.6 oz), SpO2 93.00%.  Intake/Output from previous day: 12/08 0701 - 12/09 0700 In: 510 [P.O.:510] Out: 1275 [Urine:1275]   Physical Exam:   wd male in nad Chest with a few basilar crackles, clear o/w Cor with rrr LE without edema, no cyanosis noted. Alert, oriented, moves all 4.    Lab Results:  [facility] 06/18/11 0710 06/17/11 0645  WBC 15.3* 16.7*  HGB 12.7* 12.9*  HCT 37.2* 37.1*  PLT 523* 463*   BMET  Basename 06/18/11 0710 06/17/11 0645  NA 132* 135  K 4.2 4.4  CL 95* 99  CO2 24 26  GLUCOSE 106* 108*  BUN 10 11  CREATININE 0.73 0.72  CALCIUM 8.8 8.4    Studies/Results: Dg Chest 2 View  06/18/2011  *RADIOLOGY REPORT*  Clinical Data: Pneumonia.  Chest pain.  Short of breath.  CHEST - 2 VIEW  Comparison: 06/16/2011.  Findings: Improving aeration at the left costophrenic angle. Multifocal patchy airspace disease is present, with the little change aside from at the left costophrenic angle.  Small left pleural effusion which may be loculated.  Cardiopericardial silhouette appears within normal limits.  IMPRESSION: Improving aeration of the left lung base with multifocal pneumonia and small left pleural effusion.  Original Report Authenticated By: Andreas Newport, M.D.    Assessment/Plan: Patient Active Hospital Problem List:  Acute respiratory failure with hypoxia (06/10/2011)   Assessment: pt still requiring oxygen, but will try to wean   Plan: wean o2 as needed. Bilateral pneumonia (06/10/2011)   Assessment: pt has cavitary process that is assumed to be infectious.  No further fever last 36hrs.  Now has 3 neg afb smears.  Will repeat ct chest to see if things have improved.   Plan: d/c resp isolation  Ct  chest in am to f/u.  If no change, need tissue Elevated liver enzymes (06/17/2011)   Assessment: has been decreasing   Plan: recheck lft's sometime next week.     Barbaraann Share, M.D. 06/19/2011, 10:42 AM

## 2011-06-20 ENCOUNTER — Inpatient Hospital Stay (HOSPITAL_COMMUNITY): Payer: Self-pay

## 2011-06-20 MED ORDER — SULFAMETHOXAZOLE-TMP DS 800-160 MG PO TABS: 2.0000 | ORAL_TABLET | Freq: Three times a day (TID) | ORAL | Status: AC

## 2011-06-20 MED ORDER — IBUPROFEN 400 MG PO TABS: 400.0000 mg | ORAL_TABLET | Freq: Four times a day (QID) | ORAL | Status: AC | PRN

## 2011-06-20 MED ORDER — SULFAMETHOXAZOLE-TMP DS 800-160 MG PO TABS
2.0000 | ORAL_TABLET | Freq: Three times a day (TID) | ORAL | Status: DC
  Administered 2011-06-20: 2 via ORAL
  Filled 2011-06-20 (×3): qty 2

## 2011-06-20 MED ORDER — SULFAMETHOXAZOLE-TMP DS 800-160 MG PO TABS: 2.0000 | ORAL_TABLET | Freq: Three times a day (TID) | ORAL | Status: DC

## 2011-06-20 NOTE — Progress Notes (Signed)
Subjective: Feels better  Objective: Weight change:   Intake/Output Summary (Last 24 hours) at 06/20/11 1117 Last data filed at 06/20/11 0600  Gross per 24 hour  Intake    600 ml  Output      0 ml  Net    600 ml   Blood pressure 117/72, pulse 78, temperature 97.8 F (36.6 C), temperature source Oral, resp. rate 20, height 5\' 6"  (1.676 m), weight 162 lb 0.6 oz (73.5 kg), SpO2 95.00%. Temp:  [97.5 F (36.4 C)-97.8 F (36.6 C)] 97.8 F (36.6 C) (12/10 0500) Pulse Rate:  [78-96] 78  (12/10 0500) Resp:  [20] 20  (12/10 0500) BP: (117-142)/(72-89) 117/72 mmHg (12/10 0500) SpO2:  [95 %-99 %] 95 % (12/10 0500)  Physical Exam: Generoal : alert oriented,  HEENT: anicteric sclera, pupils reactive to light and accommodation, EOMI, oropharynx clear and without exudate  CVS regular rate, normal r, no murmur rubs or gallops  Chest lungs more clear Abdomen: soft nontender, nondistended, normal bowel sounds,  Extremities: no clubbing or edema noted bilaterally  Skin: no rashes  Neuro: nonfocal, strength and sensation intact   Lab Results:  Basename 06/18/11 0710  WBC 15.3*  HGB 12.7*  HCT 37.2*  PLT 523*   BMET  Basename 06/18/11 0710  NA 132*  K 4.2  CL 95*  CO2 24  GLUCOSE 106*  BUN 10  CREATININE 0.73  CALCIUM 8.8    Micro Results: Recent Results (from the past 240 hour(s))  CULTURE, BLOOD (ROUTINE X 2)     Status: Normal   Collection Time   06/12/11  4:50 PM      Component Value Range Status Comment   Specimen Description BLOOD LEFT ARM   Final    Special Requests BOTTLES DRAWN AEROBIC AND ANAEROBIC 10CC   Final    Setup Time [id number]   Final    Culture NO GROWTH 5 DAYS   Final    Report Status 06/18/2011 FINAL   Final   CULTURE, BLOOD (ROUTINE X 2)     Status: Normal   Collection Time   06/12/11  5:00 PM      Component Value Range Status Comment   Specimen Description BLOOD LEFT WRIST   Final    Special Requests BOTTLES DRAWN AEROBIC AND ANAEROBIC 10CC    Final    Setup Time [id number]   Final    Culture NO GROWTH 5 DAYS   Final    Report Status 06/18/2011 FINAL   Final   CULTURE, SPUTUM-ASSESSMENT     Status: Normal   Collection Time   06/14/11 12:33 PM      Component Value Range Status Comment   Specimen Description SPUTUM   Final    Special Requests NONE   Final    Sputum evaluation     Final    Value: THIS SPECIMEN IS ACCEPTABLE. RESPIRATORY CULTURE REPORT TO FOLLOW.   Report Status 06/14/2011 FINAL   Final   AFB CULTURE WITH SMEAR     Status: Normal (Preliminary result)   Collection Time   06/14/11 12:33 PM      Component Value Range Status Comment   Specimen Description SPUTUM   Final    Special Requests NONE   Final    ACID FAST SMEAR NO ACID FAST BACILLI SEEN   Final    Culture     Final    Value: CULTURE WILL BE EXAMINED FOR 6 WEEKS BEFORE ISSUING A FINAL REPORT   Report  Status PENDING   Incomplete   CULTURE, RESPIRATORY     Status: Normal   Collection Time   06/14/11 12:33 PM      Component Value Range Status Comment   Specimen Description SPUTUM   Final    Special Requests NONE   Final    Gram Stain     Final    Value: ABUNDANT WBC PRESENT,BOTH PMN AND MONONUCLEAR     RARE SQUAMOUS EPITHELIAL CELLS PRESENT     RARE GRAM POSITIVE COCCI     IN PAIRS   Culture NORMAL OROPHARYNGEAL FLORA   Final    Report Status 06/16/2011 FINAL   Final   AFB CULTURE WITH SMEAR     Status: Normal (Preliminary result)   Collection Time   06/16/11  2:05 PM      Component Value Range Status Comment   Specimen Description SPUTUM   Final    Special Requests NONE   Final    ACID FAST SMEAR NO ACID FAST BACILLI SEEN   Final    Culture     Final    Value: CULTURE WILL BE EXAMINED FOR 6 WEEKS BEFORE ISSUING A FINAL REPORT   Report Status PENDING   Incomplete   AFB CULTURE WITH SMEAR     Status: Normal (Preliminary result)   Collection Time   06/17/11 10:30 AM      Component Value Range Status Comment   Specimen Description SPUTUM   Final      Special Requests NONE   Final    ACID FAST SMEAR NO ACID FAST BACILLI SEEN   Final    Culture     Final    Value: CULTURE WILL BE EXAMINED FOR 6 WEEKS BEFORE ISSUING A FINAL REPORT   Report Status PENDING   Incomplete     Studies/Results: CT CHEST WITHOUT CONTRAST  Technique: Multidetector CT imaging of the chest was performed  following the standard protocol without IV contrast.  Comparison: Plain film of 06/18/2011. CT of 06/13/2011. Initial  plain film of 06/09/2011.  Findings: Lung windows demonstrate patent airways. Moderate  improvement in multifocal, partially cavitary airspace opacities.  Somewhat more nodular components, including within the superior  segment left lower lobe on image 32 are also improved. Most  confluent disease in the right upper and left lower lobes. No well-  defined abscess. No new airspace disease.  Soft tissue windows demonstrate normal heart size with a small  pericardial effusion which is new or increased. Left-sided pleural  effusion is small and similar in size. Mild loculation identified  superomedially on image 34 and image 38. Resolved right pleural  effusion. Small middle mediastinal nodes are not significantly  changed. The bilateral hilar adenopathy described on the prior  exam is not well evaluated secondary to unenhanced technique.  Limited abdominal imaging demonstrates no significant findings. No  acute osseous abnormality.  IMPRESSION:  1. Improvement in multifocal partially cavitary airspace  opacities. Again favored to be infection. Consider anaerobic  bacteria versus Klebsiella or H influenza. Not a typical  distribution for tuberculosis or aspiration.  2. The hilar adenopathy described on the prior exam is poorly  evaluated secondary to unenhanced technique.  3. Small left pleural effusion is similar in size and demonstrates  mild loculation.  4. Small pericardial effusion is new or increased.  Original Report Authenticated  By: Consuello Bossier, M.D.   06/13/2011  *RADIOLOGY REPORT*  Clinical Data: Shortness of breath.  Cough.  Fever.  CHEST -  2 VIEW  Comparison: The exams back to 06/09/2011  Findings: Patchy asymmetric airspace disease seen bilaterally shows no substantial interval change since yesterday's film, but is progressed since 06/10/2011. Cardiopericardial silhouette is at upper limits of normal for size.  IMPRESSION: Patchy asymmetric airspace disease without substantial change since yesterday's film.  Original Report Authenticated By: ERIC A. MANSELL, M.D.    Ct Chest W Contrast  06/13/2011  *RADIOLOGY REPORT*  Clinical Data: Cough, fever, pneumonia.  Progressive lung infiltrates.  CT CHEST WITH CONTRAST  Technique:  Multidetector CT imaging of the chest was performed following the standard protocol during bolus administration of intravenous contrast.  Contrast:  80 ml Omnipaque-300  Comparison: Chest radiographs obtained earlier today.  Findings: Patchy, confluent and mass-like areas of increased density scattered throughout both lungs.  Some of these areas are developing central cavitation.  These are involving all of the lobes in both lungs.  Mildly enlarged bilateral hilar lymph nodes.  These include a 1.5 x 1.0 cm left hilar lymph node on image number 32 and a 0.9 x 0.7 cm right hilar lymph node on image number 31.  Small left pleural effusion and minimal right pleural effusion. Unremarkable upper abdomen.  IMPRESSION:  1.  Extensive bilateral cavitary pneumonia. Differential considerations include Staphylococcus, Klebsiella and fungal pneumonia.  This is not a typical distribution for tuberculosis, but this needs to be considered. 2.  Mild bilateral hilar adenopathy.  Original Report Authenticated By: Darrol Angel, M.D.   US Abdomen Complete  06/14/2011  *RADIOLOGY REPORT*  Clinical Data:  Increased LFTs  ABDOMINAL ULTRASOUND COMPLETE  Comparison:  Chest CT 06/13/2011  Findings:  Gallbladder:  No gallstones,  gallbladder wall thickening, or pericholecystic fluid. Sonographic Murphy's sign is negative.  Common Bile Duct:  Within normal limits in caliber.  Liver: No focal mass lesion identified.  Within normal limits in parenchymal echogenicity.  IVC:  Appears normal.  Pancreas:  No abnormality identified.  Spleen:  Within normal limits in size and echotexture.  Right kidney:  Normal in size and parenchymal echogenicity.  No evidence of mass or hydronephrosis.  Left kidney:  Normal in size and parenchymal echogenicity.  No evidence of mass or hydronephrosis.  Abdominal Aorta:  No aneurysm identified.  A left pleural effusion is noted.  IMPRESSION:  1.  Negative abdominal ultrasound. 2.  Left pleural effusion visualized.  Original Report Authenticated By     Antibiotics:  Anti-infectives     Start     Dose/Rate Route Frequency Ordered Stop   06/16/11 1400   metroNIDAZOLE (FLAGYL) tablet 500 mg        500 mg Oral 3 times per day 06/16/11 1110     06/15/11 2200   linezolid (ZYVOX) tablet 600 mg        600 mg Oral Every 12 hours 06/15/11 1856     06/15/11 0000   Ampicillin-Sulbactam (UNASYN) 3 g in sodium chloride 0.9 % 100 mL IVPB  Status:  Discontinued        3 g 100 mL/hr over 60 Minutes Intravenous Every 6 hours 06/14/11 1644 06/16/11 1224   06/14/11 1800   moxifloxacin (AVELOX) tablet 400 mg  Status:  Discontinued        400 mg Oral Daily-1800 06/13/11 1445 06/13/11 1721   06/14/11 1730   Ampicillin-Sulbactam (UNASYN) 3 g in sodium chloride 0.9 % 100 mL IVPB  Status:  Discontinued        3 g 100 mL/hr over 60 Minutes Intravenous  Once 06/14/11 1644 06/16/11 1109   06/14/11 1200   linezolid (ZYVOX) IVPB 600 mg  Status:  Discontinued        600 mg 300 mL/hr over 60 Minutes Intravenous 2 times daily 06/14/11 1042 06/15/11 1856   06/14/11 1100   piperacillin-tazobactam (ZOSYN) IVPB 3.375 g  Status:  Discontinued        3.375 g 12.5 mL/hr over 240 Minutes Intravenous Every 8 hours 06/14/11 0923  06/14/11 1622   06/14/11 0930   vancomycin (VANCOCIN) IVPB 1000 mg/200 mL premix  Status:  Discontinued        1,000 mg 200 mL/hr over 60 Minutes Intravenous Every 8 hours 06/14/11 0917 06/14/11 1042   06/14/11 0930   piperacillin-tazobactam (ZOSYN) IVPB 3.375 g  Status:  Discontinued        3.375 g 100 mL/hr over 30 Minutes Intravenous Every 8 hours 06/14/11 0919 06/14/11 0921   06/14/11 0000   cefTRIAXone (ROCEPHIN) 1 g in dextrose 5 % 50 mL IVPB  Status:  Discontinued        1 g 100 mL/hr over 30 Minutes Intravenous Every 24 hours 06/13/11 1725 06/14/11 0840   06/14/11 0000   azithromycin (ZITHROMAX) 500 mg in dextrose 5 % 250 mL IVPB  Status:  Discontinued        500 mg 250 mL/hr over 60 Minutes Intravenous Every 24 hours 06/13/11 1725 06/14/11 0837   06/10/11 1430   vancomycin (VANCOCIN) IVPB 1000 mg/200 mL premix  Status:  Discontinued        1,000 mg 200 mL/hr over 60 Minutes Intravenous Every 8 hours 06/10/11 1358 06/11/11 1925   06/10/11 1000   oseltamivir (TAMIFLU) capsule 75 mg  Status:  Discontinued     Comments: FOR FIVE DAYS      75 mg Oral 2 times daily 06/10/11 0128 06/16/11 0952   06/09/11 2115   cefTRIAXone (ROCEPHIN) 1 g in dextrose 5 % 50 mL IVPB  Status:  Discontinued        1 g 100 mL/hr over 30 Minutes Intravenous Every 24 hours 06/09/11 2110 06/13/11 1718   06/09/11 2115   azithromycin (ZITHROMAX) 500 mg in dextrose 5 % 250 mL IVPB  Status:  Discontinued        500 mg 250 mL/hr over 60 Minutes Intravenous Every 24 hours 06/09/11 2110 06/13/11 1718          Medications: Scheduled Meds:    . enoxaparin (LOVENOX) injection  40 mg Subcutaneous Q24H  . linezolid  600 mg Oral Q12H  . metroNIDAZOLE  500 mg Oral Q8H   Continuous Infusions:  PRN Meds:.alum & mag hydroxide-simeth, ibuprofen, levalbuterol, ondansetron (ZOFRAN) IV, ondansetron  Assessment/Plan: Nathaniel Zuniga is a 35 y.o. male with hx of tobacco, marijuana use and heavy etoh use who has a  multifocal cavitary pneumonia that has not responded to typical CAP coverage    1) Cavitary PNeumonia:   Differential includes MRSA/MSSA pneumonia in context of influenza infection or post influenza. Certainly an progressive aspiration pneumonia that could have festered after his losing consciousness is possible. Finally we need to consider possibility of TB and of fungal infection. 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 AFB sputum negative x 3. Qferon negative. His CT repeat is negative.  --I would change him to high dose bactrim 2 ds tid to finish out total  of 21 days anti mRSA coverage --cointinue flagyl for 4 more days -  IP:  --3 AFB's negative, sp rx with tamiful and afebrile for several days (though he was on antipyretics) --ok with dcing all precations  Can plug into [facility] with ID clinic MD for fu in next 2 weeks     LOS: 11 days   Acey Lav 06/20/2011, 11:17 AM

## 2011-06-20 NOTE — Discharge Summary (Signed)
Pt seen and examined, agree with discharge plans.   Sandrea Hughs, MD Pulmonary and Critical Care Medicine [facility] Cell [phone]

## 2011-06-20 NOTE — Progress Notes (Signed)
HISTORY of PRESENT ILLNESS: Nathaniel Zuniga is a 35 y.o. male smoker admitted on 06/09/2011 with multi-focal pulmonary infiltrates, fever, hypoxemia.  His symptoms started very abuptly 11/22.  His twin brother was recently dx with flu.  Found to have cavitary b/l infiltrates on CT chest.  Cultures: 11/30 Flu A / B>>>negative 11/30 H1N1>>>negative 11/30 MRSA PCR>>>negative 11/30 HIV>>>Nonreactive 11/30 BCx2>>>negative 11/30 UA>>>negative 12/02 BCx2>> neg 12/04 Sputum>>negative 12/04 Sputum AFB>>smear neg >> 12/07 Sputum AFB>> neg smear >>>  Abx: 11/29 Rocephin>>>12/03 11/29 Zithromax>>>12/03 11/29 Tamiflu>>>12/06 11/30 Vanc>>12/1 12/03 Zosyn>>12/04 12/03 Vancomycin>>12/04 12/04 Linezolid (ID/? Staph)>> 12/04 Unasyn>>12/06 12/06 Flagyl(ID/ ? Anaerobic)>>  Tests: 12/03 CT chest>>patchy, confluent mass like densities b/l with central cavitation, Lt hilar node 1.5 x 1 cm, Rt hilar node 0.9 x 0.7 cm, small b/l effusions 12/03>>ESR 110, RF 13, ANA negative, anti-CCP < 2, ANCA negative 12/04>>Hep A IgM, Hep B surface Ag, Hep C IgM, HCV Ab all negative 12/04>>Quantiferon Gold assay indeterminate 12/05>>Quantiferon Gold assay negative  Subjective Tm 98.7.  Only one doses of motrin yesterday>>none given today.  Feels better, decreased sweats and chest pain.  Decreased cough and sputum.  Blood pressure 117/72, pulse 78, temperature 97.8 F (36.6 C), temperature source Oral, resp. rate 20, height 5\' 6"  (1.676 m), weight 162 lb 0.6 oz (73.5 kg), SpO2 95.00%.  PHYICAL EXAM:  General - no distress HEENT - no sinus tenderness Cardiac - s1s2 regular, no murmur Chest - bronchial breath sounds b/l with rales Lt base Abd - soft, non tender Ext - no edema Neuro - normal strength/ambulating s problems   LABS BMET    Component Value Date/Time   NA 132* 06/18/2011 0710   K 4.2 06/18/2011 0710   CL 95* 06/18/2011 0710   CO2 24 06/18/2011 0710   GLUCOSE 106* 06/18/2011 0710   BUN 10 06/18/2011  0710   CREATININE 0.73 06/18/2011 0710   CALCIUM 8.8 06/18/2011 0710   GFRNONAA >90 06/18/2011 0710   GFRAA >90 06/18/2011 0710    Lab 06/18/11 0710 06/17/11 0645 06/15/11 0640  ALT 516* 606* 814*  AST 120* 169* 456*  GGT -- -- --  ALKPHOS 163* 166* 153*  BILITOT 0.3 0.3 0.4    CBC    Component Value Date/Time   WBC 15.3* 06/18/2011 0710   RBC 4.13* 06/18/2011 0710   HGB 12.7* 06/18/2011 0710   HCT 37.2* 06/18/2011 0710   PLT 523* 06/18/2011 0710   MCV 90.1 06/18/2011 0710   MCH 30.8 06/18/2011 0710   MCHC 34.1 06/18/2011 0710   RDW 13.4 06/18/2011 0710   LYMPHSABS 1.6 06/13/2011 0920   MONOABS 3.6* 06/13/2011 0920   EOSABS 0.0 06/13/2011 0920   BASOSABS 0.2* 06/13/2011 0920    Ct Chest Wo Contrast  12/10/2012IMPRESSION:  1.  Improvement in multifocal partially cavitary airspace opacities.  Again favored to be infection.  Consider anaerobic bacteria versus  Klebsiella or H influenza.   Not a typical distribution for tuberculosis or aspiration. 2.  The hilar adenopathy described on the prior exam is poorly evaluated secondary to unenhanced technique. 3.  Small left pleural effusion is similar in size and demonstrates mild loculation. 4.  Small pericardial effusion is new or increased.  Original Report Authenticated By: Consuello Bossier, M.D.      ASSESSMENT/PLAN:  B/L pulmonary infiltrates with fever -CT chest shows cavitary/necrotizing lesions -likely bacterial pneumonia -doubt tuberculosis>>f/u sputum for AFB rx per dashboard/ID  -completed course of tamiflu - most likely this has been treated effectively though  possibility of  mssa  or mrsa superinfection may need to be treated beyond inpt rx  Lt pleural effusion Minimal by CT 12/10  Hypoxemia Resolved  Tobacco abuse -will need further education about smoking cessation  Pain, fever -motrin prn>>has decreased need for motrin  Elevated LFT -trending down -viral hepatitis panel negative    I favor discharge home on  something he can afford like doxycycline to cover staph x 1 more week then f/u cxr at [facility] office (he lives closest to this sight) but will defer to ID  Sandrea Hughs, MD Pulmonary and Critical Care Medicine [facility] Cell [phone]

## 2011-06-20 NOTE — Discharge Summary (Signed)
Physician Discharge Summary  Patient ID: Nathaniel Zuniga MRN: [id number] DOB/AGE: December 27, 1975 35 y.o.  Admit date: 06/09/2011 Discharge date: 06/20/2011  Admission Diagnoses:   Discharge Diagnoses:  Principal Problem:  *Acute respiratory failure with hypoxia Active Problems:  Bilateral pneumonia (cavitary)  SIRS (systemic inflammatory response syndrome)  Fever  Leukocytosis  Volume depletion  Tachycardia  Tobacco abuse  Elevated liver enzymes    Culture data 11/30 Flu A / B>>>negative  11/30 H1N1>>>negative  11/30 MRSA PCR>>>negative  11/30 HIV>>>Nonreactive  11/30 BCx2>>>negative  11/30 UA>>>negative  12/02 BCx2>> neg  12/04 Sputum>>negative  12/04 Sputum AFB>>smear neg >>  12/07 Sputum AFB>> neg smear >>>   Antibiotics  11/29 Rocephin>>>12/03  11/29 Zithromax>>>12/03  11/29 Tamiflu>>>12/06  11/30 Vanc>>12/1  12/03 Zosyn>>12/04  12/03 Vancomycin>>12/04  12/04 Linezolid (ID/? Staph)>>  12/04 Unasyn>>12/06  12/06 Flagyl(ID/ ? Anaerobic)>>   Tests/studies:  12/03 CT chest>>patchy, confluent mass like densities b/l with central cavitation, Lt hilar node 1.5 x 1 cm, Rt hilar node 0.9 x 0.7 cm, small b/l effusions  12/03>>ESR 110, RF 13, ANA negative, anti-CCP < 2, ANCA negative  12/04>>Hep A IgM, Hep B surface Ag, Hep C IgM, HCV Ab all negative  12/04>>Quantiferon Gold assay indeterminate  12/05>>Quantiferon Gold assay negative  HISTORY of PRESENT ILLNESS:  Nathaniel Zuniga is a 35 y.o. male smoker admitted on 06/09/2011 with multi-focal pulmonary infiltrates, fever, hypoxemia. His symptoms started very abuptly 11/22. His twin brother was recently dx with flu. Found to have cavitary b/l infiltrates on CT chest.   Hospital Course:  Principal Problem:  Acute respiratory failure with hypoxia in setting of Bilateral pneumonia (cavitary):  initially admitted on 11/29 with cough with fever chills or last one week which has been progressively worse. Also reported left-sided  shoulder pain and low back pain and only takes a deep breath. He came to the ER where he was found to be febrile with temperatures around 102 103F. Chest x-ray shows multifocal pneumonia. Patient's brother recently diagnosed with flu and was taking Tamiflu. Patient took couple of his brothers medications. Despite which he did not improve. At this time patient has been admitted for further management of his pneumonia. Initial evaluation demonstrated bilateral airspace disease. Pulmonary was asked to evaluate on 11/30. A CT of chest was obtained on 12/3 showing: 12/03 CT chest>>patchy, confluent mass like densities b/l with central cavitation, Lt hilar node 1.5 x 1 cm, Rt hilar node 0.9 x 0.7 cm, small b/l effusions. Infectious disease was also consulted for cavitary pneumonia. All diagnostic data as listed above. Ultimately the differential diagnoses were felt to be: Differential includes MRSA/MSSA pneumonia in context of influenza infection or post influenza. He made slow clinical and symptomatic improvement with empiric antibiotic coverage. It was felt he would need MRSA coverage in the out-pt setting. Per ID recs he will be discharged to home on Bactrim, with plan to complete 21 day total therapy, as well as follow up with both pulmonary and ID sub-specialty.    SIRS (systemic inflammatory response syndrome):  Resolved  This was in the setting of PNA and improved with IV fluids and antibiotics.    Volume depletion Resolved with IVFs   Tobacco abuse Encouraged to stop smoking     Elevated liver enzymes Lab Results  Component Value Date   ALT 516* 06/18/2011   AST 120* 06/18/2011   ALKPHOS 163* 06/18/2011   BILITOT 0.3 06/18/2011  plan: -will need follow up in out-pt setting.   Discharge Exam: Blood pressure  117/72, pulse 78, temperature 97.8 F (36.6 C), temperature source Oral, resp. rate 20, height 5\' 6"  (1.676 m), weight 162 lb 0.6 oz (73.5 kg), SpO2 95.00%.  Temp: [97.5 F (36.4 C)-97.8  F (36.6 C)] 97.8 F (36.6 C) (12/10 0500)  Pulse Rate: [78-96] 78 (12/10 0500)  Resp: [20] 20 (12/10 0500)  BP: (117-142)/(72-89) 117/72 mmHg (12/10 0500)  SpO2: [95 %-99 %] 95 % (12/10 0500)   Physical Exam:  General : alert oriented,  HEENT: anicteric sclera, pupils reactive to light and accommodation, EOMI, oropharynx clear and without exudate  CVS regular rate, normal r, no murmur rubs or gallops  Chest lungs more clear  Abdomen: soft nontender, nondistended, normal bowel sounds,  Extremities: no clubbing or edema noted bilaterally  Skin: no rashes  Neuro: nonfocal, strength and sensation intact   Labs at discharge Lab Results  Component Value Date   CREATININE 0.73 06/18/2011   BUN 10 06/18/2011   NA 132* 06/18/2011   K 4.2 06/18/2011   CL 95* 06/18/2011   CO2 24 06/18/2011   Lab Results  Component Value Date   WBC 15.3* 06/18/2011   HGB 12.7* 06/18/2011   HCT 37.2* 06/18/2011   MCV 90.1 06/18/2011   PLT 523* 06/18/2011   Lab Results  Component Value Date   ALT 516* 06/18/2011   AST 120* 06/18/2011   ALKPHOS 163* 06/18/2011   BILITOT 0.3 06/18/2011   No results found for this basename: INR,  PROTIME    Current radiology studies Ct Chest Wo Contrast  06/20/2011  *RADIOLOGY REPORT*  Clinical Data: Follow up of "cavitary infiltrates".  Cough.  CT CHEST WITHOUT CONTRAST  Technique:  Multidetector CT imaging of the chest was performed following the standard protocol without IV contrast.  Comparison: Plain film of 06/18/2011.  CT of 06/13/2011.  Initial plain film of 06/09/2011.  Findings: Lung windows demonstrate patent airways.  Moderate improvement in multifocal, partially cavitary airspace opacities. Somewhat more nodular components, including within the superior segment left lower lobe on image 32 are also improved.  Most confluent disease in the right upper and left lower lobes.  No well- defined abscess.  No new airspace disease.  Soft tissue windows demonstrate normal heart  size with a small pericardial effusion which is new or increased.  Left-sided pleural effusion is small and similar in size.  Mild loculation identified superomedially on image 34 and image 38.  Resolved right pleural effusion.  Small middle mediastinal nodes are not significantly changed.  The bilateral hilar adenopathy described on the prior exam is not well evaluated secondary to unenhanced technique.  Limited abdominal imaging demonstrates no significant findings.  No acute osseous abnormality.  IMPRESSION:  1.  Improvement in multifocal partially cavitary airspace opacities.  Again favored to be infection.  Consider anaerobic bacteria versus  Klebsiella or H influenza.   Not a typical distribution for tuberculosis or aspiration. 2.  The hilar adenopathy described on the prior exam is poorly evaluated secondary to unenhanced technique. 3.  Small left pleural effusion is similar in size and demonstrates mild loculation. 4.  Small pericardial effusion is new or increased.  Original Report Authenticated By: Consuello Bossier, M.D.    Disposition:  Final discharge disposition not confirmed  Discharge Orders    Future Appointments: Provider: Department: Dept Phone: Center:   06/30/2011 1:30 PM Shan Levans, MD [facility] [phone] None   07/01/2011 9:00 AM Judyann Munson, MD [facility] [phone] [facility]     Future  Orders Please Complete By Expires   Diet - low sodium heart healthy      Increase activity slowly      Call MD for:  temperature >100.4      Call MD for:  persistant nausea and vomiting      Call MD for:  severe uncontrolled pain      Call MD for:  hives      Call MD for:  difficulty breathing, headache or visual disturbances      Call MD for:  persistant dizziness or light-headedness      (HEART FAILURE PATIENTS) Call MD:  Anytime you have any of the following symptoms: 1) 3 pound weight gain in 24 hours or 5 pounds in 1 week 2) shortness of breath, with or without a dry  hacking cough 3) swelling in the hands, feet or stomach 4) if you have to sleep on extra pillows at night in order to breathe.         Follow-up Information    Follow up with Shan Levans, MD on 06/30/2011. (at the [place] office: at 130pm)    Contact information:   [address] [facility] [address] [place] [address] [phone]       Follow up with Judyann Munson, MD on 07/01/2011. (at 9am)    Contact information:   [address] [place] [address] [phone]          Discharged Condition: good  Signed: Adrain Butrick,PETE 06/20/2011, 3:53 PM

## 2011-06-29 ENCOUNTER — Encounter: Payer: Self-pay | Admitting: Critical Care Medicine

## 2011-06-30 ENCOUNTER — Other Ambulatory Visit: Payer: Self-pay | Admitting: Critical Care Medicine

## 2011-06-30 ENCOUNTER — Ambulatory Visit (INDEPENDENT_AMBULATORY_CARE_PROVIDER_SITE_OTHER): Payer: Self-pay | Admitting: Critical Care Medicine

## 2011-06-30 ENCOUNTER — Encounter: Payer: Self-pay | Admitting: Critical Care Medicine

## 2011-06-30 ENCOUNTER — Ambulatory Visit (HOSPITAL_BASED_OUTPATIENT_CLINIC_OR_DEPARTMENT_OTHER)
Admission: RE | Admit: 2011-06-30 | Discharge: 2011-06-30 | Disposition: A | Discharge status: Home / Self Care | Payer: Self-pay | Source: Ambulatory Visit | Attending: Critical Care Medicine | Admitting: Critical Care Medicine

## 2011-06-30 VITALS — BP 126/88 | HR 91 | Temp 99.1°F | Ht 66.0 in | Wt 151.0 lb

## 2011-06-30 DIAGNOSIS — Z720 Tobacco use: Secondary | ICD-10-CM

## 2011-06-30 DIAGNOSIS — J90 Pleural effusion, not elsewhere classified: Secondary | ICD-10-CM | POA: Insufficient documentation

## 2011-06-30 DIAGNOSIS — F17200 Nicotine dependence, unspecified, uncomplicated: Secondary | ICD-10-CM

## 2011-06-30 DIAGNOSIS — J189 Pneumonia, unspecified organism: Secondary | ICD-10-CM | POA: Insufficient documentation

## 2011-06-30 DIAGNOSIS — R748 Abnormal levels of other serum enzymes: Secondary | ICD-10-CM

## 2011-06-30 NOTE — Progress Notes (Signed)
Subjective:    Patient ID: Nathaniel Zuniga, male    DOB: [birth year], 35 y.o.   MRN: [id number]  HPI  35 y.o. M recent adm for bilateral PNA Just d/c from hosp one week ago.  Since d/c is better.  No smoking now.  Has a few bactrim left. Now min mucus.     Pt admitted 11/29-12/10.  Rx multiple IV abx. Had CT chest with cavitary lesions in multiple locations c/w MRSA/MSSA  infection.   All c/s neg to date. ID saw pt.  Rec d/c on prolonged bactrim 21days course. Pt has a few days left.  Pt feels better.  Did have elevated LFTs .  Hx of tobacco use, now is off cigarettes.   Past Medical History  Diagnosis Date  . SIRS (systemic inflammatory response syndrome)   . Acute respiratory failure   . Leukocytosis   . Volume depletion   . Tachycardia   . Tobacco abuse   . Elevated liver enzymes      Family History  Problem Relation Age of Onset  . Coronary artery disease Other      History   Social History  . Marital Status: Single    Spouse Name: N/A    Number of Children: N/A  . Years of Education: N/A   Occupational History  . works as Music therapist    Social History Main Topics  . Smoking status: Former Smoker -- 1.0 packs/day for 18 years    Types: Cigarettes    Quit date: 06/09/2011  . Smokeless tobacco: Not on file  . Alcohol Use: Yes  . Drug Use: Yes     occ marijuana  . Sexually Active: Not on file   Other Topics Concern  . Not on file   Social History Narrative   Currently full time student for architecture (2012)Father is homeless and he has little contact with him.Mother is in a nursing home from a TBI.     No Known Allergies   Outpatient Prescriptions Prior to Visit  Medication Sig Dispense Refill  . ibuprofen (ADVIL,MOTRIN) 400 MG tablet Take 1 tablet (400 mg total) by mouth every 6 (six) hours as needed.  30 tablet    . sulfamethoxazole-trimethoprim (BACTRIM DS) 800-160 MG per tablet Take 2 tablets by mouth every 8 (eight) hours.  66 tablet  0      Review of Systems Constitutional:   No  weight loss, night sweats,  Fevers, chills, fatigue, lassitude. HEENT:   No headaches,  Difficulty swallowing,  Tooth/dental problems,  Sore throat,                No sneezing, itching, ear ache, nasal congestion, post nasal drip,   CV:  No chest pain,  Orthopnea, PND, swelling in lower extremities, anasarca, dizziness, palpitations  GI  No heartburn, indigestion, abdominal pain, nausea, vomiting, diarrhea, change in bowel habits, loss of appetite  Resp: No shortness of breath with exertion or at rest.  No excess mucus, no productive cough,  No non-productive cough,  No coughing up of blood.  No change in color of mucus.  No wheezing.  No chest wall deformity  Skin: no rash or lesions.  GU: no dysuria, change in color of urine, no urgency or frequency.  No flank pain.  MS:  No joint pain or swelling.  No decreased range of motion.  No back pain.  Psych:  No change in mood or affect. No depression or anxiety.  No memory  loss.     Objective:   Physical Exam Filed Vitals:   06/30/11 1332  BP: 126/88  Pulse: 91  Temp: 99.1 F (37.3 C)  TempSrc: Oral  Height: 5\' 6"  (1.676 m)  Weight: 151 lb (68.493 kg)  SpO2: 97%    Gen: Pleasant, well-nourished, in no distress,  normal affect, muscular, minimal fat  ENT: No lesions,  mouth clear,  oropharynx clear, no postnasal drip  Neck: No JVD, no TMG, no carotid bruits  Lungs: No use of accessory muscles, no dullness to percussion, clear without rales or rhonchi  Cardiovascular: RRR, heart sounds normal, no murmur or gallops, no peripheral edema  Abdomen: soft and NT, no HSM,  BS normal  Musculoskeletal: No deformities, no cyanosis or clubbing  Neuro: alert, non focal  Skin: Warm, no lesions or rashes  Dg Chest 2 View  06/30/2011  *RADIOLOGY REPORT*  Clinical Data: 35 year old male with pneumonia, recently hospitalized.  CHEST - 2 VIEW  Comparison: Chest CT 06/20/2011 and earlier  studies.  Findings: Interval significant regression of bilateral peribronchovascular airspace disease and nodularity.  Residual small/trace pleural effusions, greater on the left.  Scarring or linear atelectasis laterally on the left. Normal cardiac size and mediastinal contours.  Visualized tracheal air column is within normal limits.  No pneumothorax.  No areas of worsening ventilation. No acute osseous abnormality identified.  IMPRESSION: Interval regression of bilateral nodular and confluent airspace disease and small pleural effusions.  There is not yet radiographic resolution.  Original Report Authenticated By: Harley Hallmark, M.D.          Assessment & Plan:   Bilateral pneumonia Bilateral necrotizing CAP with bilateral upper lobe cavitations c/w MSSA/MRSA.  Now improved on 21 days of bactrim CXR 06/30/11: improved  Plan Finish Bactrim course Check CBC ,hepatic function  Keep ID appt   Tobacco abuse Now off cigarettes Plan Monitor off cigarettes  Elevated liver enzymes Hx of elevated LFTs d/t infection Plan Monitor hepatic function panel     Updated Medication List Outpatient Encounter Prescriptions as of 06/30/2011  Medication Sig Dispense Refill  . ibuprofen (ADVIL,MOTRIN) 400 MG tablet Take 1 tablet (400 mg total) by mouth every 6 (six) hours as needed.  30 tablet    . sulfamethoxazole-trimethoprim (BACTRIM DS) 800-160 MG per tablet Take 2 tablets by mouth every 8 (eight) hours.  66 tablet  0

## 2011-06-30 NOTE — Assessment & Plan Note (Signed)
Bilateral necrotizing CAP with bilateral upper lobe cavitations c/w MSSA/MRSA.  Now improved on 21 days of bactrim CXR 06/30/11: improved  Plan Finish Bactrim course Check CBC ,hepatic function  Keep ID appt

## 2011-06-30 NOTE — Assessment & Plan Note (Signed)
Hx of elevated LFTs d/t infection Plan Monitor hepatic function panel

## 2011-06-30 NOTE — Assessment & Plan Note (Signed)
Now off cigarettes Plan Monitor off cigarettes

## 2011-06-30 NOTE — Patient Instructions (Signed)
Chest xray today Lab today Finish Bactrim Keep appt with Infectious Diseases today I will call with lab/xray results Return as needed

## 2011-07-01 ENCOUNTER — Encounter: Payer: Self-pay | Admitting: Internal Medicine

## 2011-07-01 ENCOUNTER — Ambulatory Visit (INDEPENDENT_AMBULATORY_CARE_PROVIDER_SITE_OTHER): Payer: Self-pay | Admitting: Internal Medicine

## 2011-07-01 VITALS — BP 132/81 | HR 89 | Temp 98.4°F | Ht 66.0 in | Wt 154.0 lb

## 2011-07-01 DIAGNOSIS — Z0000 Encounter for general adult medical examination without abnormal findings: Secondary | ICD-10-CM

## 2011-07-01 LAB — HEPATIC FUNCTION PANEL
ALT: 205 U/L — ABNORMAL HIGH (ref 0–53)
AST: 80 U/L — ABNORMAL HIGH (ref 0–37)
Alkaline Phosphatase: 102 U/L (ref 39–117)
Total Protein: 7.9 g/dL (ref 6.0–8.3)

## 2011-07-01 LAB — CBC WITH DIFFERENTIAL/PLATELET
Basophils Absolute: 0.1 10*3/uL (ref 0.0–0.1)
Basophils Relative: 1 % (ref 0–1)
Eosinophils Absolute: 0.1 10*3/uL (ref 0.0–0.7)
MCHC: 33 g/dL (ref 30.0–36.0)
Neutro Abs: 1.9 10*3/uL (ref 1.7–7.7)
Neutrophils Relative %: 44 % (ref 43–77)
Platelets: 386 10*3/uL (ref 150–400)
RDW: 14.1 % (ref 11.5–15.5)

## 2011-07-01 NOTE — Progress Notes (Signed)
Quick Note:  Call pt and tell her labs are ok, No change in medications His CBC is improved. His liver functions are better. Please avoid alcohol. Finish antibiotics as prescribed ______

## 2011-07-01 NOTE — Progress Notes (Signed)
INFECTIOUS DISEASES HOSPITAL FOLLOW UP VISIT  RFV: hospital f/u for multifocal pneumonia  Subjective:    Patient ID: Nathaniel Zuniga, male    DOB: Aug 17, 1975, 35 y.o.   MRN: [id number]  HPI  Nathaniel Zuniga is a 35 y.o. male , previously healthy male who smokes admitted from 06/09/2011- 06/20/11 for multi-focal pulmonary infiltrates, fever, hypoxemia. His symptoms started very abuptly 11/22 with cough, fever, chills x 1 week that   progressively worsened. Also reported left-sided shoulder pain and low back pain and only takes a deep breath. In ED, he was found to be febrile with temperatures around 102-103F. Chest x-ray shows multifocal pneumonia. The patient had sick contact, since his brother was diagnosed with flu and was taking oseltamivir. Patient took couple of his brothers medications. Despite which he did not improve.  Initial on CXR he had bilateral airspace disease.12/03 CT chest>>patchy, confluent mass like densities b/l with central cavitation, with, small b/l effusions. Infectious disease was also consulted for cavitary pneumonia. Only 1 respiratory culture was collected which showed few GPC, not identified further. He was ruled out for TB by IGRA and 3 sputum AFB -. Ultimately the differential diagnoses were felt to be: Differential includes MRSA/MSSA pneumonia in context of influenza infection or post influenza. He made slow clinical and symptomatic improvement with empiric antibiotic coverage.  he will  discharged to home on Bactrim, with plan to complete 21 day total therapy, as well as follow up with both pulmonary and ID sub-specialty. He was last seen by Dr. Montine Circle where his LFTs and CBC was checked,showing improvement since his hospitalization.  The patient is still finishing up his 21 day course of bactrim (DS 2 tabs TID), and does not have any associated diarrhea or rash. He states that he has less productive cough. His phlegm is clear. Occasionally still has low grade fever,  100.4, last one occurring last night. He takes ibuprofen as needed for pain. He feels that he is 75% of his baseline. Still short of breath with strenous activity, such as working out, but denies DOE. This morning he notices sensation of wheezing but otherwise, does not feel that he wheezes routinely. He has good appetite, but still not back to his baseline weight of 170, currently at 154. Despite his 10 day hospitalization, and convelescent, he was able to catch up on the missing work from his college classes, [employer].  Since being ill, he has not resumed smoking.  Current Outpatient Prescriptions  Medication Sig Dispense Refill  . ibuprofen (ADVIL,MOTRIN) 400 MG tablet Take 1 tablet (400 mg total) by mouth every 6 (six) hours as needed.  30 tablet    . sulfamethoxazole-trimethoprim (BACTRIM DS) 800-160 MG per tablet Take 2 tablets by mouth every 8 (eight) hours.  66 tablet  0   Active Ambulatory Problems    Diagnosis Date Noted  . Bilateral pneumonia 06/10/2011  . Tobacco abuse 06/10/2011  . Elevated liver enzymes 06/17/2011   Resolved Ambulatory Problems    Diagnosis Date Noted  . SIRS (systemic inflammatory response syndrome) 06/10/2011  . Acute respiratory failure with hypoxia 06/10/2011  . Fever 06/10/2011  . Leukocytosis 06/10/2011  . Volume depletion 06/10/2011  . Tachycardia 06/10/2011   Past Medical History  Diagnosis Date  . Acute respiratory failure    Antibiotic History  11/29 Rocephin>>>12/03  11/29 Zithromax>>>12/03  11/29 Tamiflu>>>12/06  11/30 Vanc>>12/1  12/03 Zosyn>>12/04  12/03 Vancomycin>>12/04  12/04 Linezolid (ID/? Staph)>>  12/04 Unasyn>>12/06  12/06 Flagyl(ID/ ? Anaerobic)>>  Review of Systems Review of Systems  Constitutional: postive for fever x 1 day. No chills, diaphoresis, activity change, appetite change, fatigue and unexpected weight change.  HENT: Negative for congestion, sore throat, rhinorrhea, sneezing, trouble swallowing and  sinus pressure.  Eyes: Negative for photophobia and visual disturbance.  Respiratory: Negative for cough, chest tightness. Has SOB with strenuous activity.  Cardiovascular: Negative for chest pain, palpitations and leg swelling.  Gastrointestinal: Negative for nausea, vomiting, abdominal pain, diarrhea, constipation, blood in stool, abdominal distention and anal bleeding.  Genitourinary: Negative for dysuria, hematuria, flank pain and difficulty urinating.  Musculoskeletal: Negative for myalgias, back pain, joint swelling, arthralgias and gait problem.  Skin: Negative for color change, pallor, rash and wound.  Neurological: Negative for dizziness, tremors, weakness and light-headedness.  Hematological: Negative for adenopathy. Does not bruise/bleed easily.  Psychiatric/Behavioral: Negative for behavioral problems, confusion, sleep disturbance, dysphoric mood, decreased concentration and agitation.       Objective:   Physical Exam BP 132/81  Pulse 89  Temp(Src) 98.4 F (36.9 C) (Oral)  Ht 5\' 6"  (1.676 m)  Wt 154 lb (69.854 kg)  BMI 24.86 kg/m2   General Appearance:    Alert, cooperative, no distress, appears stated age  Head:    Normocephalic, without obvious abnormality, atraumatic  Eyes:    PERRL, conjunctiva/corneas clear, EOM's intact, left eye pterygium   Ears:    Normal TM's and external ear canals, both ears  Nose:   Nares normal, septum midline, mucosa normal, no drainage   or sinus tenderness  Throat:   Lips, mucosa, and tongue normal; teeth and gums normal  Neck:   Supple, symmetrical, trachea midline, no adenopathy;       thyroid:  No enlargement/tenderness/nodules; no carotid   bruit or JVD  Back:     Symmetric, no curvature, ROM normal, no CVA tenderness  Lungs:     Expiratory wheeze throughout, respirations unlabored  Chest wall:    No tenderness or deformity  Heart:    Regular rate and rhythm, S1 and S2 normal, no murmur, rub   or gallop  Abdomen:     Soft,  non-tender, bowel sounds active all four quadrants,    no masses, no organomegaly        Extremities:   Extremities normal, atraumatic, no cyanosis or edema  Pulses:   2+ and symmetric all extremities  Skin:   Skin color, texture, turgor normal, no rashes or lesions  Lymph nodes:   Cervical, supraclavicular, and axillary nodes normal  Neurologic:   CNII-XII intact. Normal strength, sensation and reflexes      throughout    Culture data  11/30 Flu A / B>>>negative  11/30 H1N1>>>negative  11/30 MRSA PCR>>>negative  11/30 HIV>>>Nonreactive  11/30 BCx2>>>negative  11/30 UA>>>negative  12/02 BCx2>> neg  12/04 Sputum>>negative  12/04 Sputum AFB>>smear neg >>  12/07 Sputum AFB>> neg smear >>>   Tests/studies:  12/03 CT chest>>patchy, confluent mass like densities b/l with central cavitation, Lt hilar node 1.5 x 1 cm, Rt hilar node 0.9 x 0.7 cm, small b/l effusions  12/03>>ESR 110, RF 13, ANA negative, anti-CCP < 2, ANCA negative  12/04>>Hep A IgM, Hep B surface Ag, Hep C IgM, HCV Ab all negative  12/04>>Quantiferon Gold assay indeterminate  12/05>>Quantiferon Gold assay negative    Labs:  CBC    Component Value Date/Time   WBC 4.2 06/30/2011 0151   RBC 4.43 06/30/2011 0151   HGB 13.7 06/30/2011 0151   HCT 41.5 06/30/2011 0151  PLT 386 06/30/2011 0151   MCV 93.7 06/30/2011 0151   MCH 30.9 06/30/2011 0151   MCHC 33.0 06/30/2011 0151   RDW 14.1 06/30/2011 0151   LYMPHSABS 1.3 06/30/2011 0151   MONOABS 0.9 06/30/2011 0151   EOSABS 0.1 06/30/2011 0151   BASOSABS 0.1 06/30/2011 0151    CMP     Component Value Date/Time   NA 132* 06/18/2011 0710   K 4.2 06/18/2011 0710   CL 95* 06/18/2011 0710   CO2 24 06/18/2011 0710   GLUCOSE 106* 06/18/2011 0710   BUN 10 06/18/2011 0710   CREATININE 0.73 06/18/2011 0710   CALCIUM 8.8 06/18/2011 0710   PROT 7.9 06/30/2011 0151   ALBUMIN 3.8 06/30/2011 0151   AST 80* 06/30/2011 0151   ALT 205* 06/30/2011 0151   ALKPHOS 102 06/30/2011  0151   BILITOT 0.2* 06/30/2011 0151   GFRNONAA >90 06/18/2011 0710   GFRAA >90 06/18/2011 0710       Assessment & Plan:  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  Wheezing = if it continues to impact him and noticeably associated with DOE, will ask the patient to call to get albuterol inhaler  Health maintenance = will give influenza vaccine at this visit. It appears that the patient likely had influenza, however, he was flu negative on PCR. He would still benefit from immunization for other strains of flu.  Fevers = if patient has ongoing fevers while off of antibiotics, we will ask patient to come in for further evaluation  Anticipate he will be back to baseline in about a week.   45 minutes spent with patient, with greater than 50% interviewing patient, and reviewing old records

## 2011-07-04 ENCOUNTER — Other Ambulatory Visit: Payer: Self-pay | Admitting: Internal Medicine

## 2012-10-06 IMAGING — US US ABDOMEN COMPLETE
1 series · 14 of 25 positions shown · non-contrast
Comparison: Chest CT 06/13/2011

CLINICAL DATA: Increased LFTs

ABDOMINAL ULTRASOUND COMPLETE

[Series 1: us abdomen complete · 0.28mm/px · 14 of 76 slices shown]
[im 1/76]
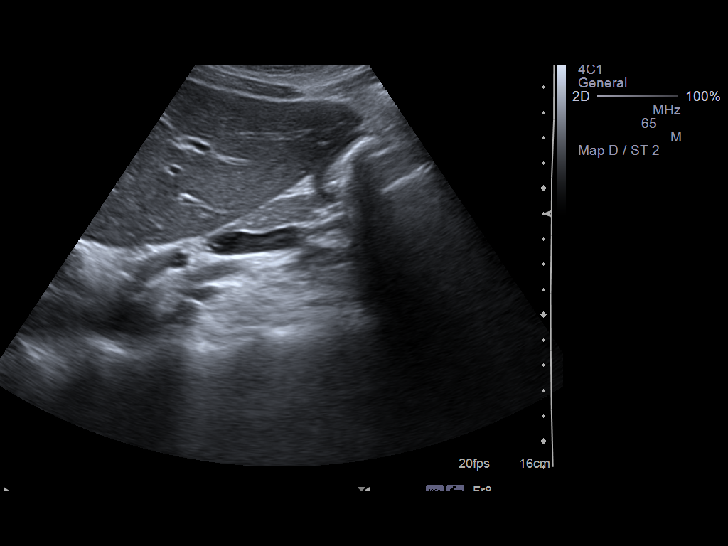
[im 7/76]
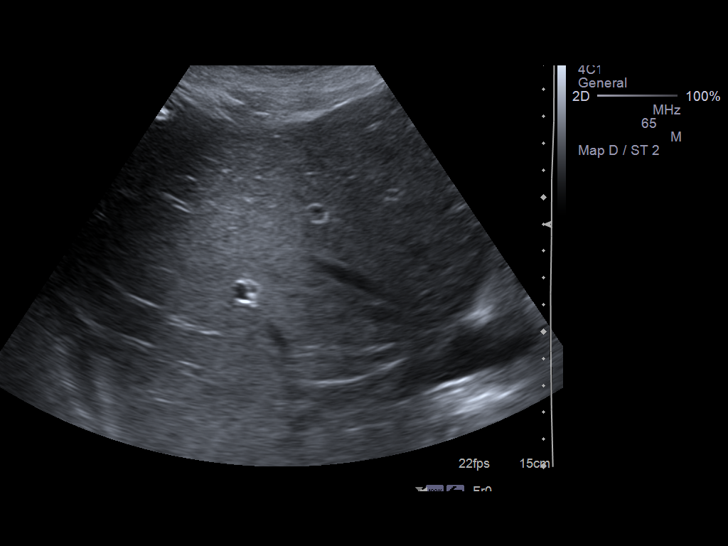
[im 13/76]
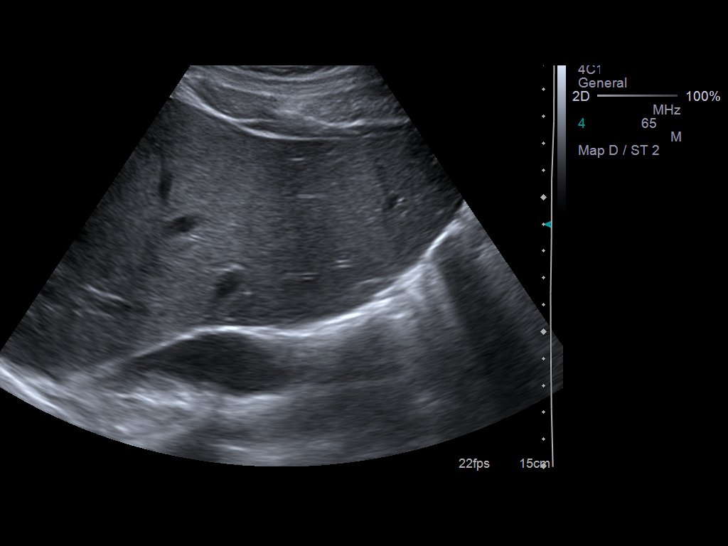
[im 19/76]
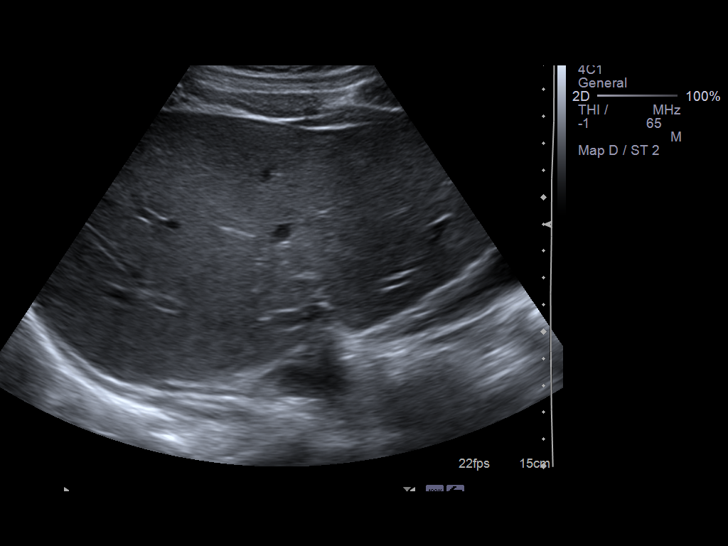
[im 26/76]
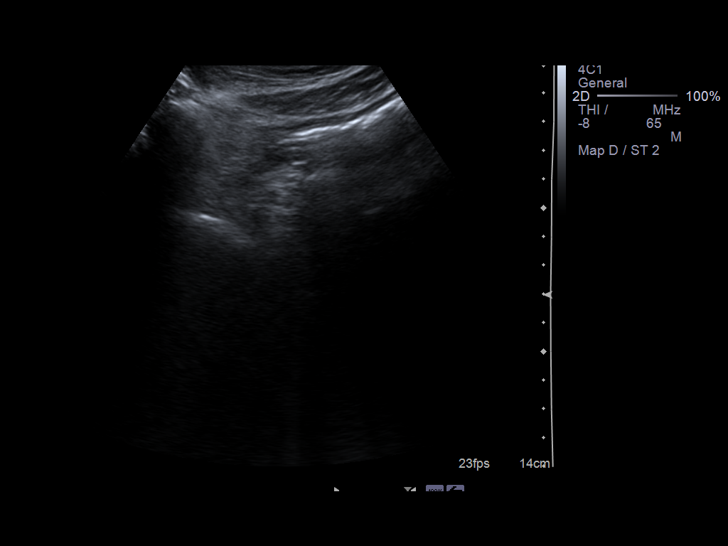
[im 29/76]
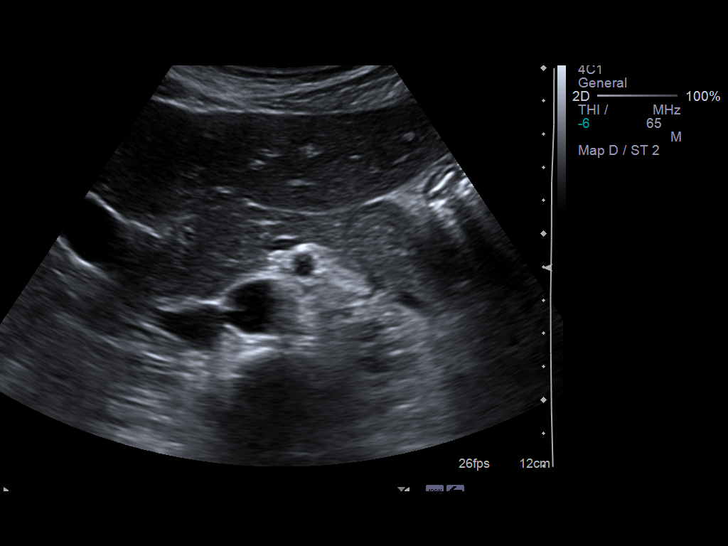
[im 35/76]
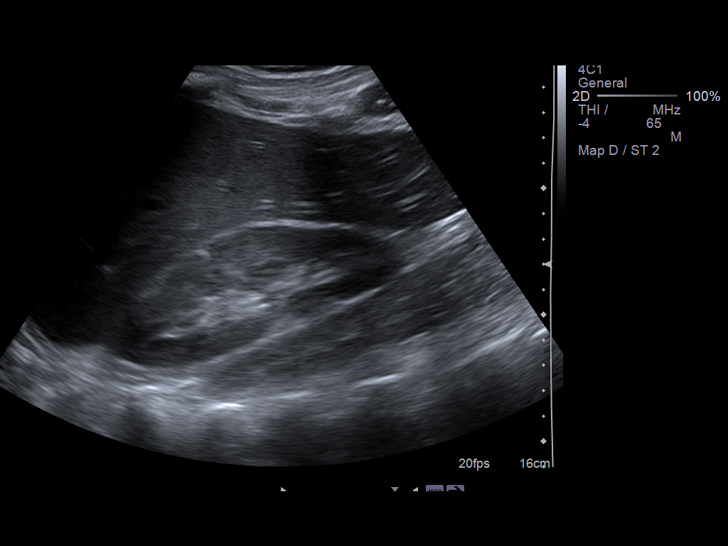
[im 41/76]
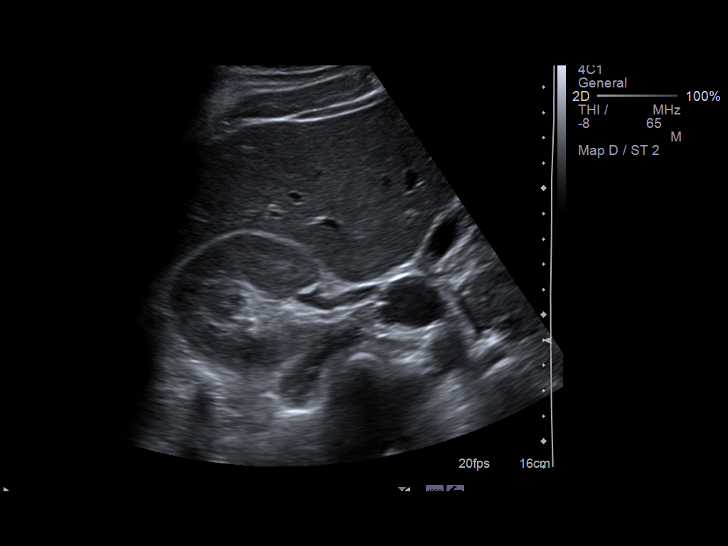
[im 47/76]
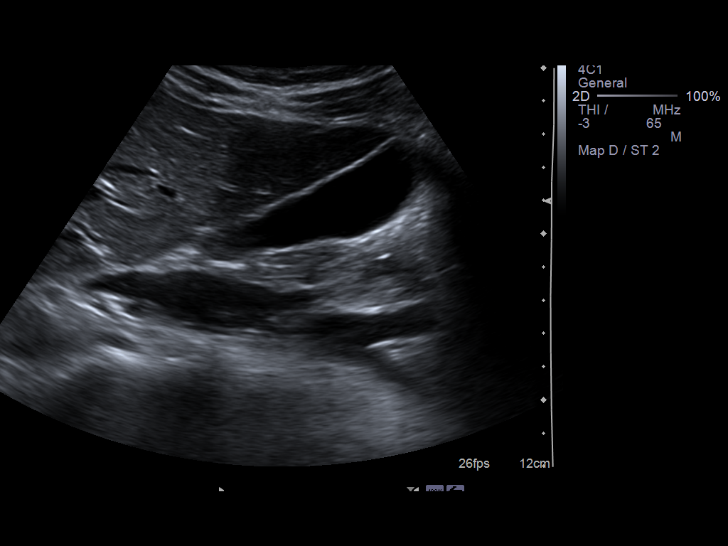
[im 51/76]
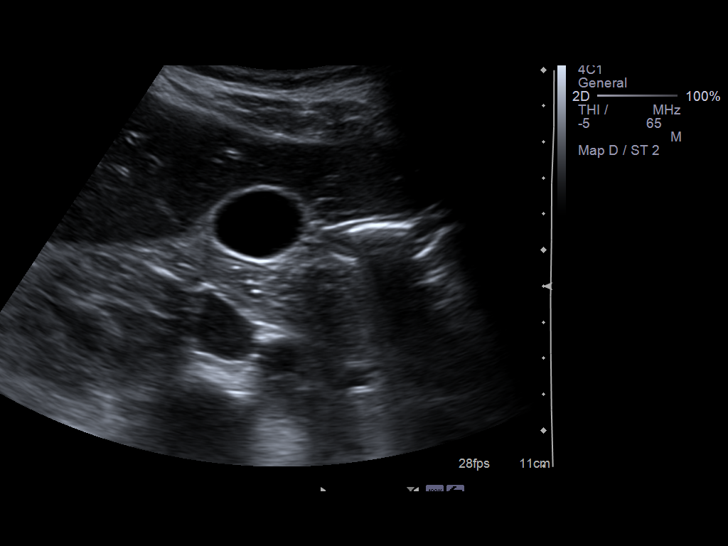
[im 57/76]
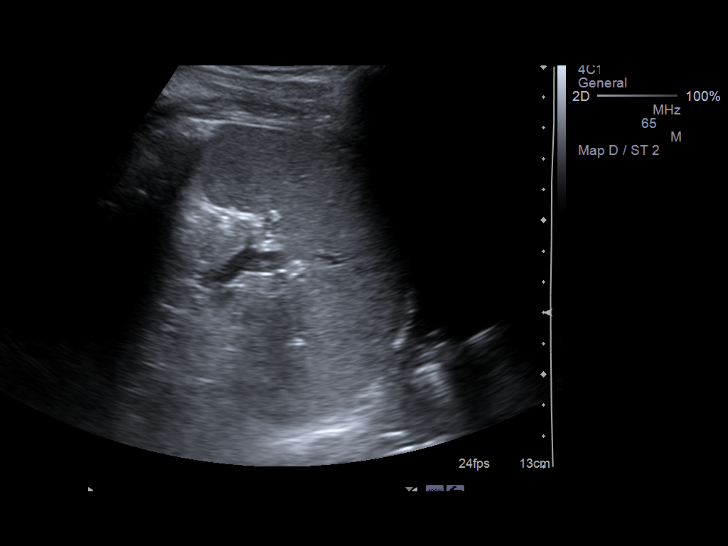
[im 63/76]
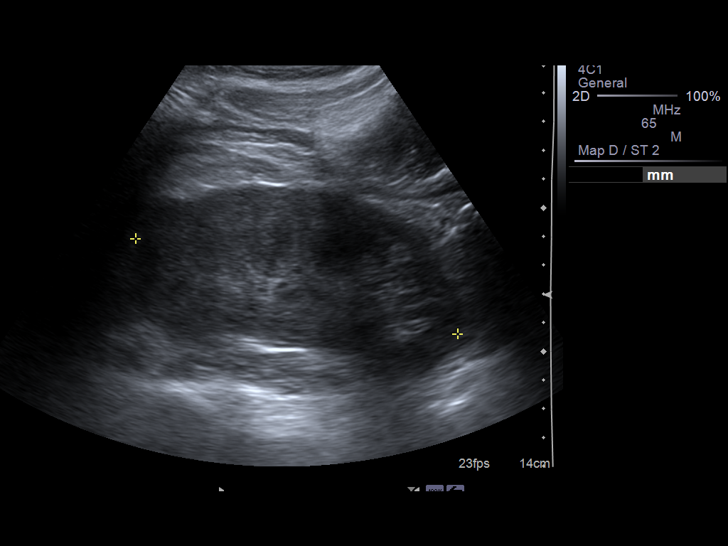
[im 69/76]
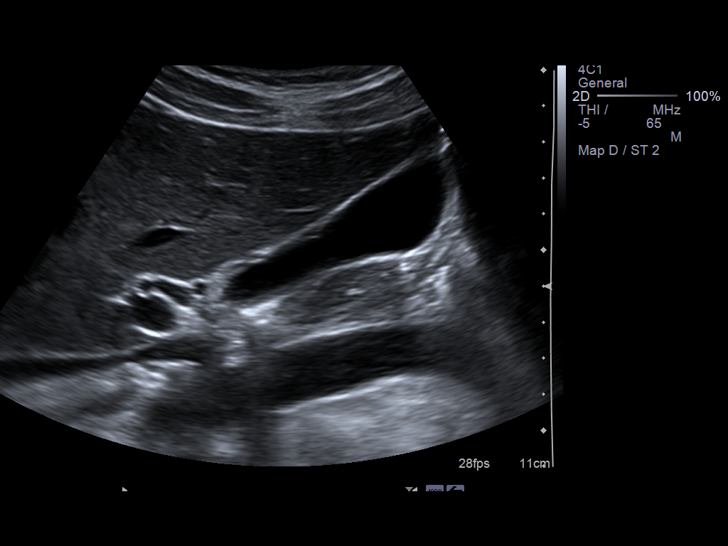
[im 76/76]
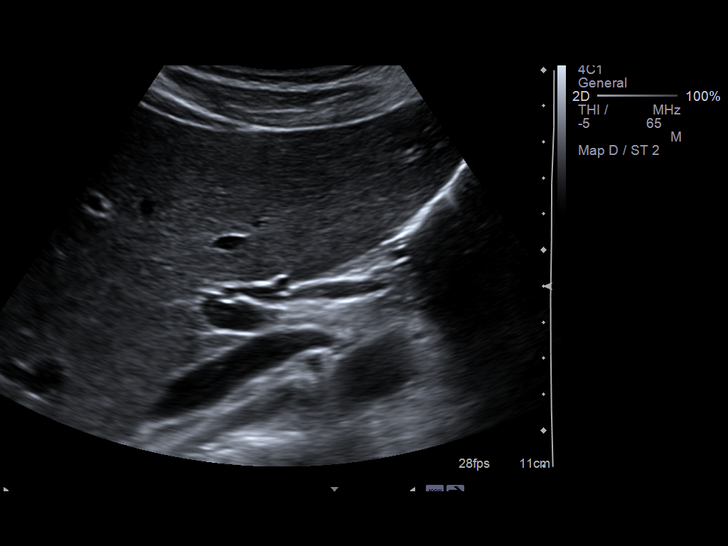

[14 of 25 positions shown; findings below may reference images not displayed]

FINDINGS: Gallbladder:  No gallstones, gallbladder wall thickening, or
pericholecystic fluid. Sonographic Murphy's sign is negative.

Common Bile Duct:  Within normal limits in caliber.

Liver: No focal mass lesion identified.  Within normal limits in
parenchymal echogenicity.

IVC:  Appears normal.

Pancreas:  No abnormality identified.

Spleen:  Within normal limits in size and echotexture.

Right kidney:  Normal in size and parenchymal echogenicity.  No
evidence of mass or hydronephrosis.

Left kidney:  Normal in size and parenchymal echogenicity.  No
evidence of mass or hydronephrosis.

Abdominal Aorta:  No aneurysm identified.

A left pleural effusion is noted.
IMPRESSION: 1.  Negative abdominal ultrasound.
2.  Left pleural effusion visualized.

## 2012-10-10 IMAGING — CR DG CHEST 2V
2 series · 2 of 2 positions shown · non-contrast
Comparison: 06/16/2011.

CLINICAL DATA: Pneumonia.  Chest pain.  Short of breath.

CHEST - 2 VIEW

[w chest pa]
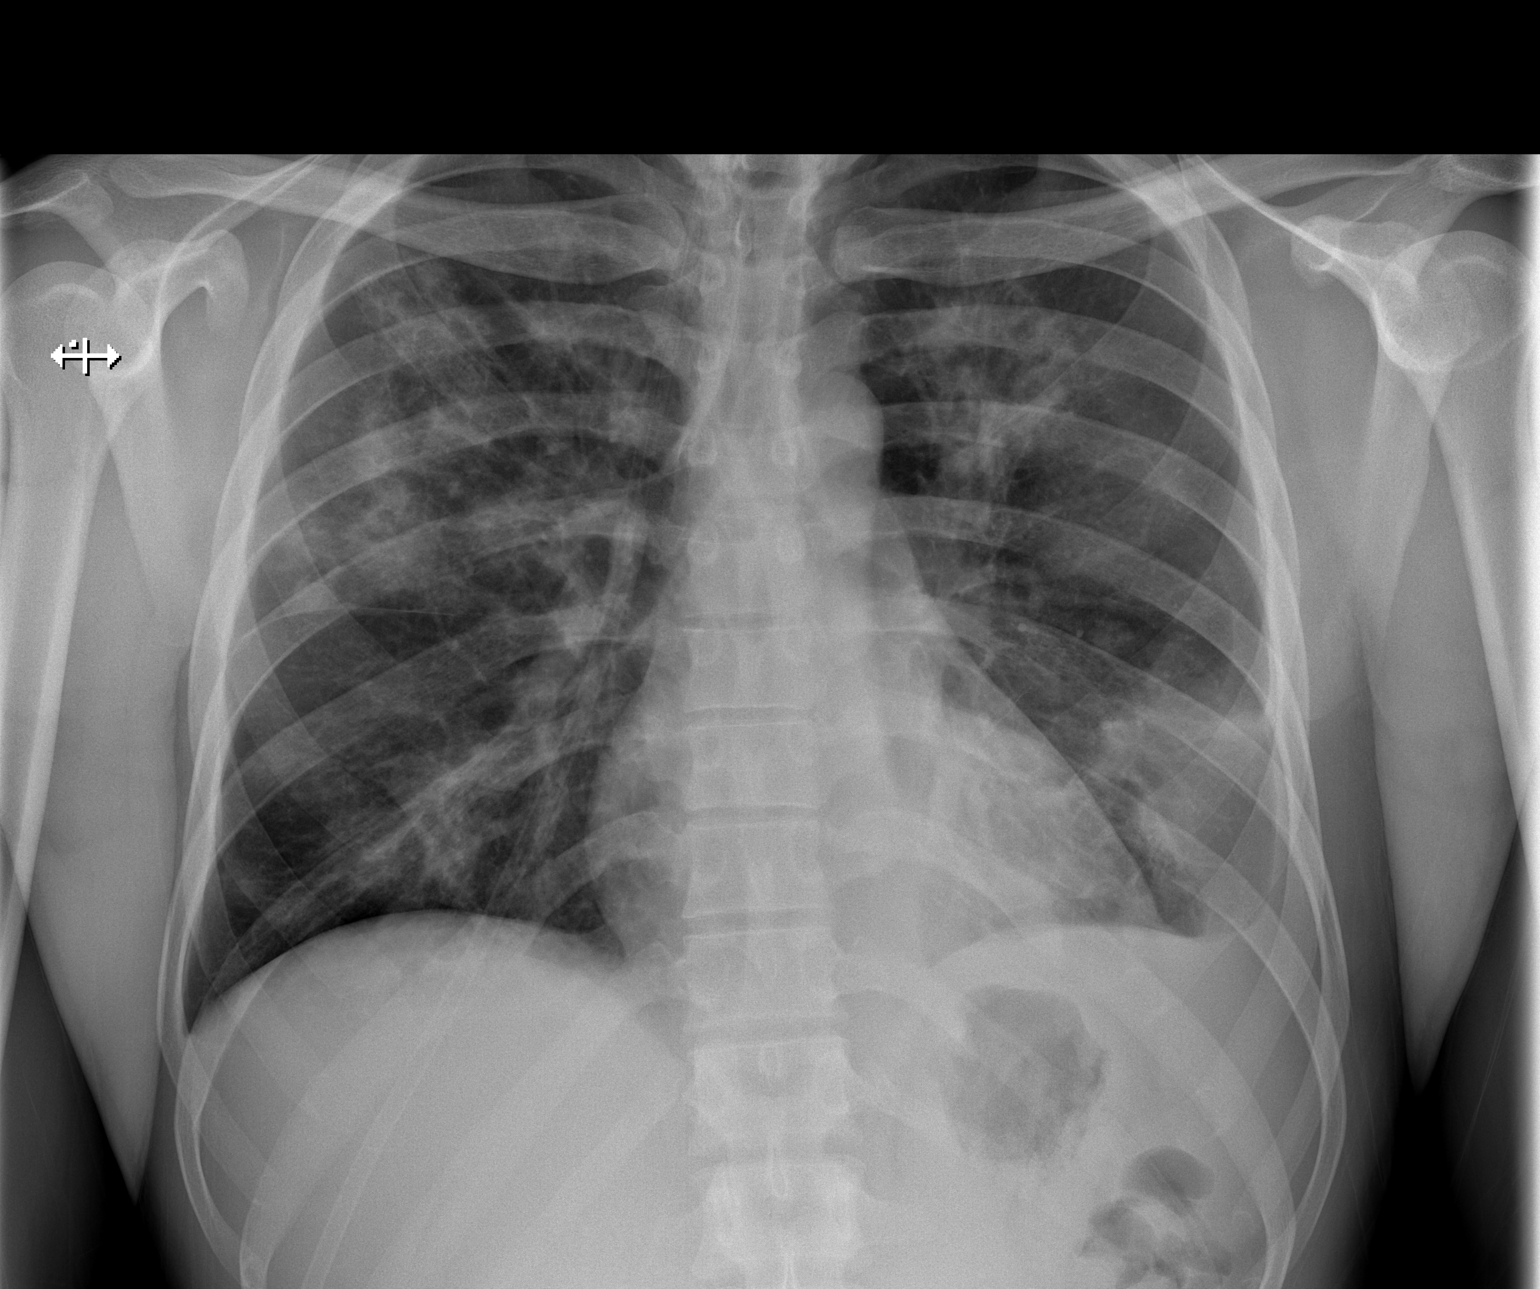

[w chest lat]
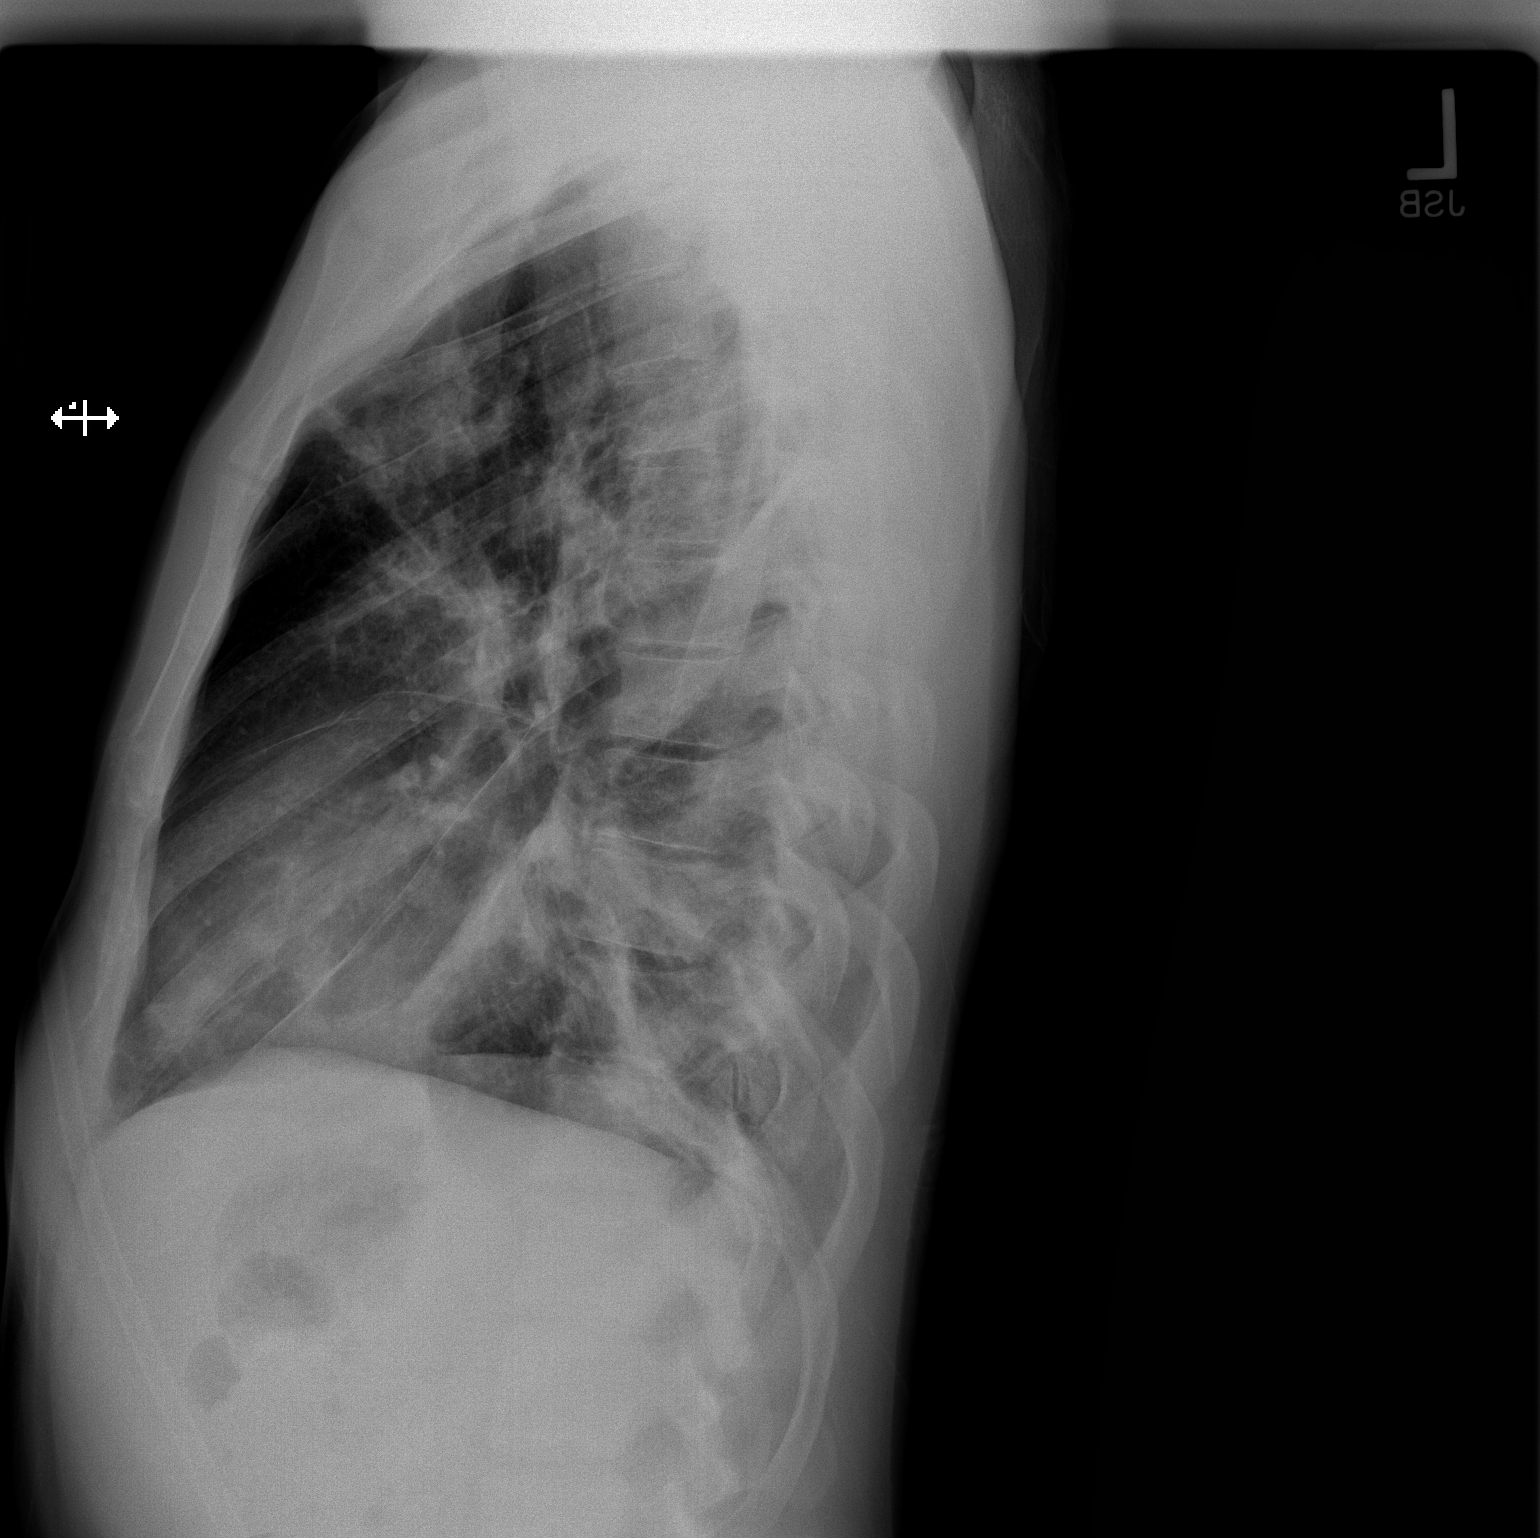

[2 of 2 positions shown; findings below may reference images not displayed]

FINDINGS: Improving aeration at the left costophrenic angle.
Multifocal patchy airspace disease is present, with the little
change aside from at the left costophrenic angle.  Small left
pleural effusion which may be loculated.  Cardiopericardial
silhouette appears within normal limits.
IMPRESSION: Improving aeration of the left lung base with multifocal pneumonia
and small left pleural effusion.

## 2012-10-12 IMAGING — CT CT CHEST W/O CM
1 of 2 series · 15 of 30 positions shown, 19 images · non-contrast
Comparison: Plain film of 06/18/2011.  CT of 06/13/2011.  Initial
plain film of 06/09/2011.

CLINICAL DATA: Follow up of "cavitary infiltrates".  Cough.

CT CHEST WITHOUT CONTRAST
TECHNIQUE: Multidetector CT imaging of the chest was performed
following the standard protocol without IV contrast.

[Series 401: sagittals · sagittal · 0.70mm/px · 15 of 106 slices shown, 19 images]
[im 8/106  mediastinal]
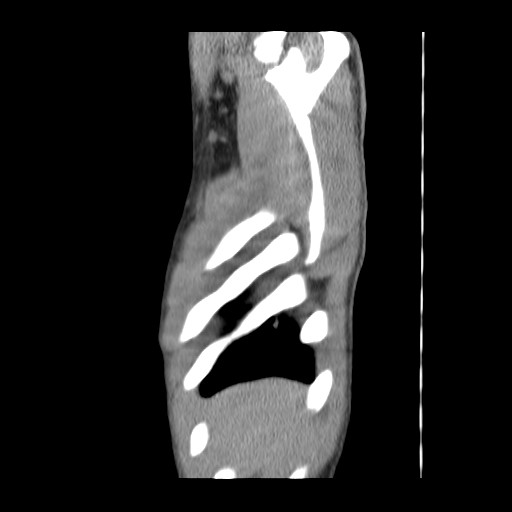
[im 8/106  lung]
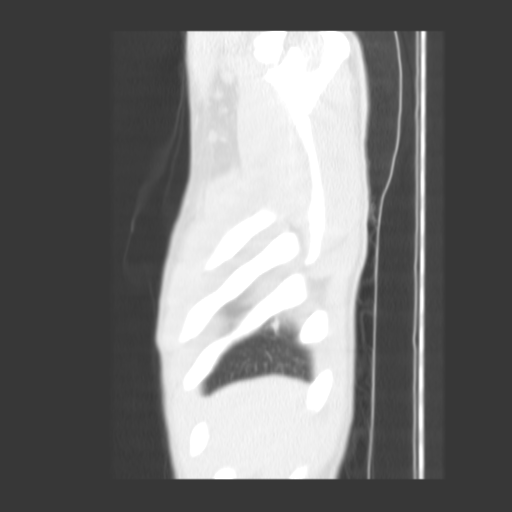
[im 15/106  lung]
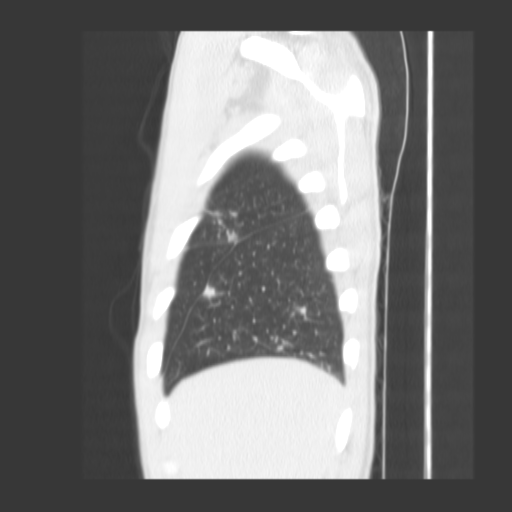
[im 22/106  lung]
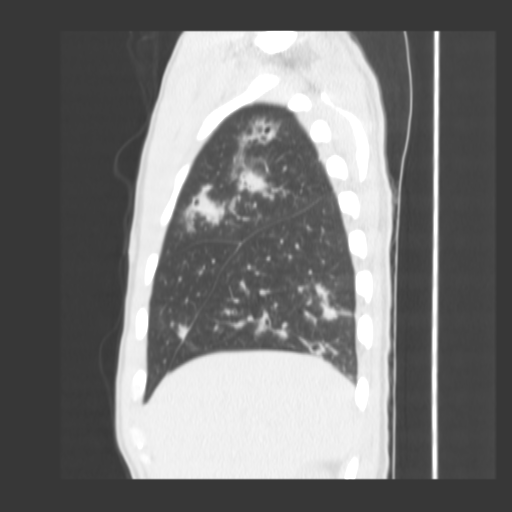
[im 29/106  lung]
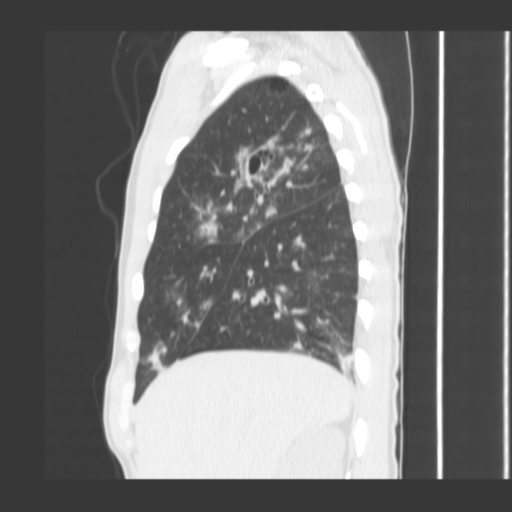
[im 36/106  mediastinal]
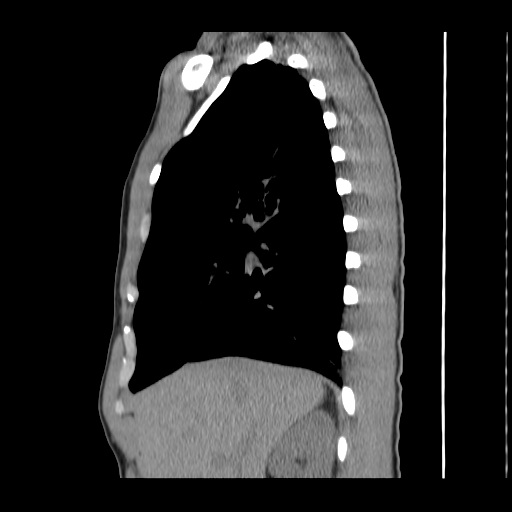
[im 36/106  lung]
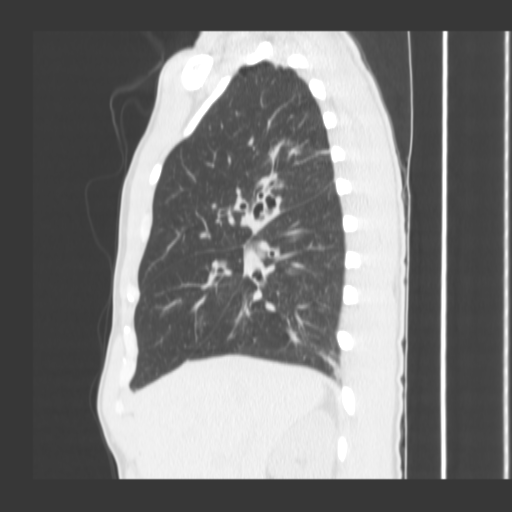
[im 43/106  lung]
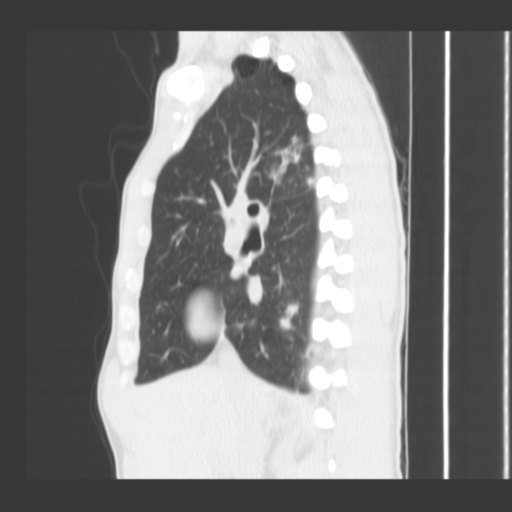
[im 50/106  lung]
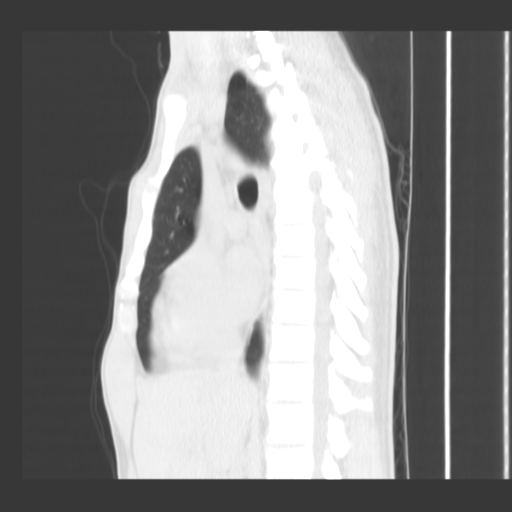
[im 53/106  lung]
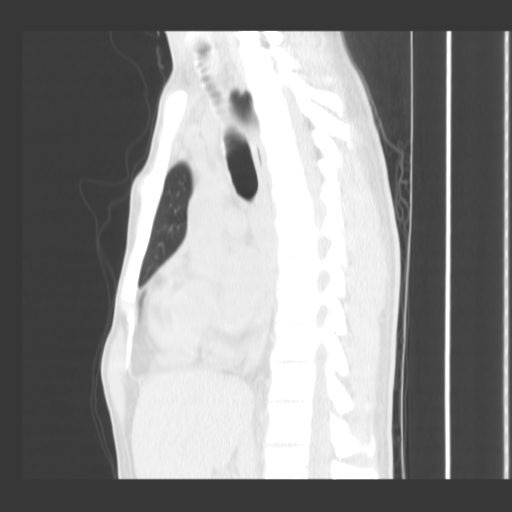
[im 57/106  mediastinal]
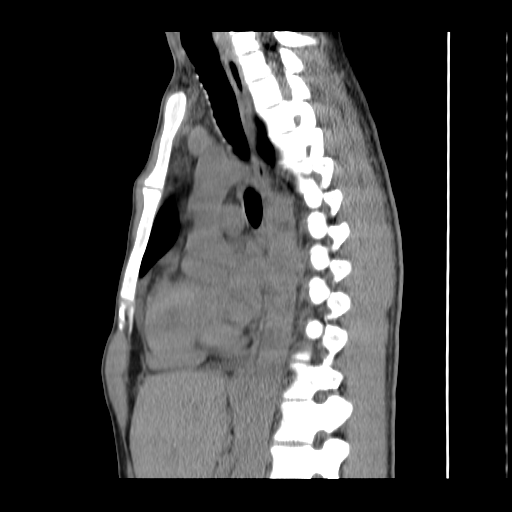
[im 57/106  lung]
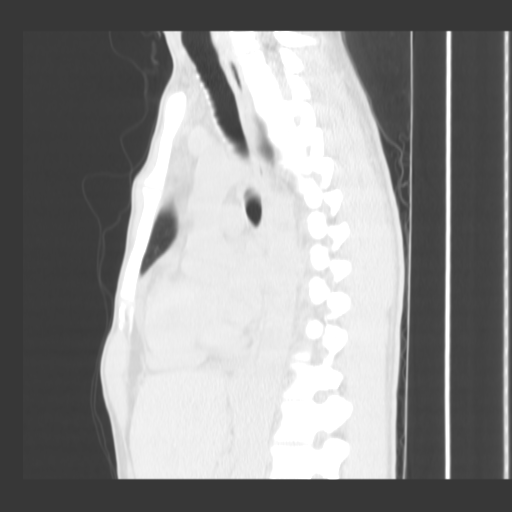
[im 64/106  lung]
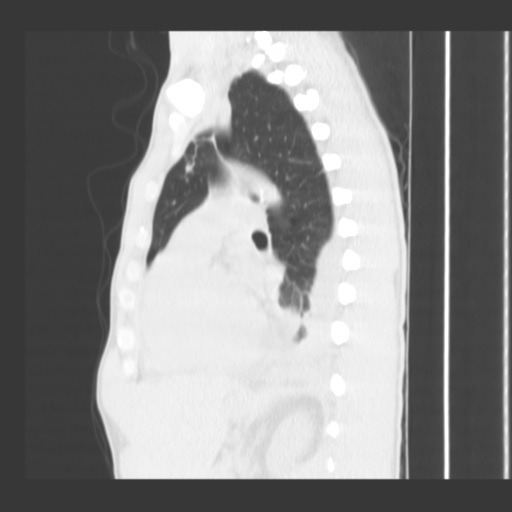
[im 71/106  lung]
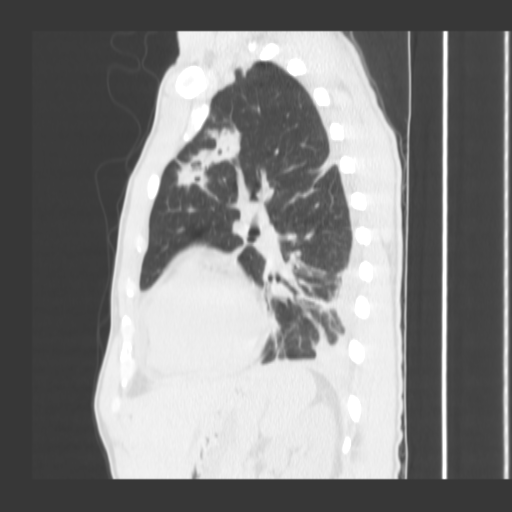
[im 78/106  lung]
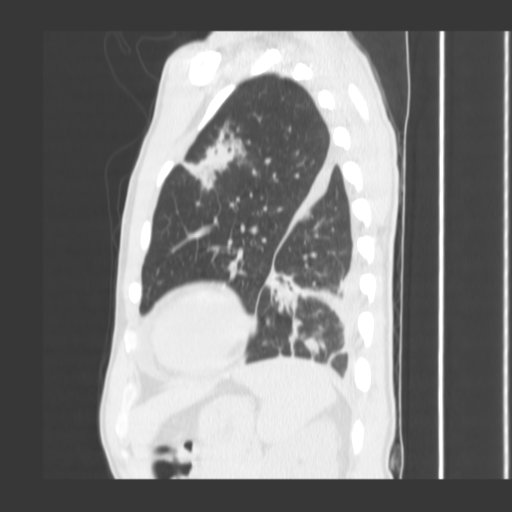
[im 85/106  mediastinal]
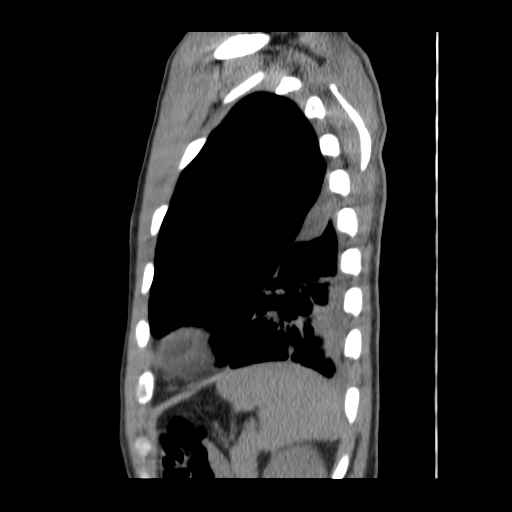
[im 85/106  lung]
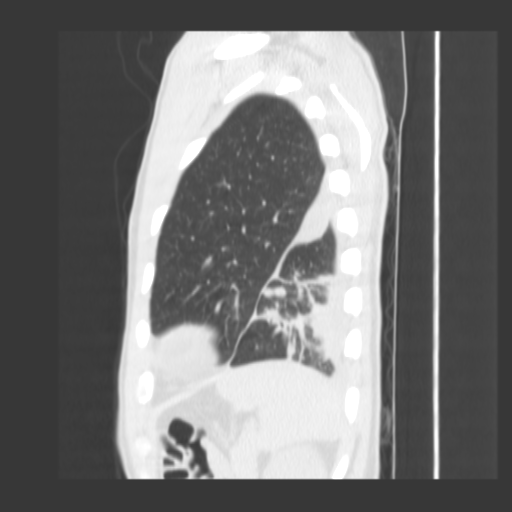
[im 92/106  lung]
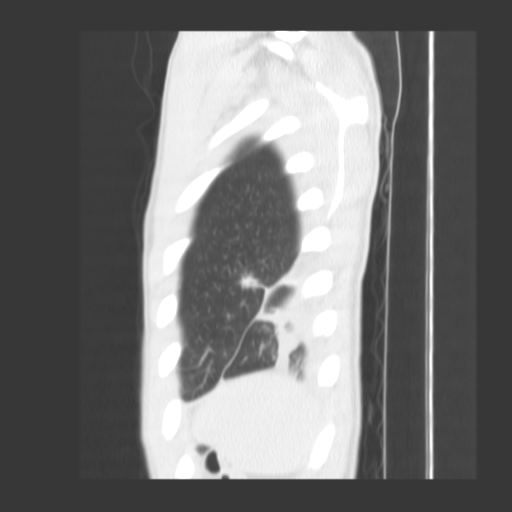
[im 99/106  lung]
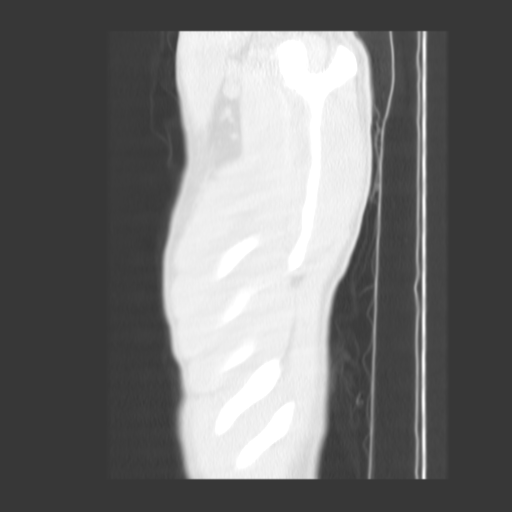

[15 of 30 positions shown; findings below may reference images not displayed]

FINDINGS: Lung windows demonstrate patent airways.  Moderate
improvement in multifocal, partially cavitary airspace opacities.
Somewhat more nodular components, including within the superior
segment left lower lobe on image 32 are also improved.  Most
confluent disease in the right upper and left lower lobes.  No well-
defined abscess.  No new airspace disease.

Soft tissue windows demonstrate normal heart size with a small
pericardial effusion which is new or increased.  Left-sided pleural
effusion is small and similar in size.  Mild loculation identified
superomedially on image 34 and image 38.  Resolved right pleural
effusion.  Small middle mediastinal nodes are not significantly
changed.  The bilateral hilar adenopathy described on the prior
exam is not well evaluated secondary to unenhanced technique.

Limited abdominal imaging demonstrates no significant findings.  No
acute osseous abnormality.
IMPRESSION: 1.  Improvement in multifocal partially cavitary airspace
opacities.  Again favored to be infection.  Consider anaerobic
bacteria versus  Klebsiella or H influenza.   Not a typical
distribution for tuberculosis or aspiration.
2.  The hilar adenopathy described on the prior exam is poorly
evaluated secondary to unenhanced technique.
3.  Small left pleural effusion is similar in size and demonstrates
mild loculation.
4.  Small pericardial effusion is new or increased.

## 2016-11-30 ENCOUNTER — Encounter (HOSPITAL_COMMUNITY): Payer: Self-pay | Admitting: Emergency Medicine

## 2016-11-30 ENCOUNTER — Inpatient Hospital Stay (HOSPITAL_COMMUNITY)
Admission: EM | Admit: 2016-11-30 | Discharge: 2016-12-04 | DRG: 571 | Disposition: A | Discharge status: Home / Self Care | Payer: Self-pay | Attending: Internal Medicine | Admitting: Internal Medicine

## 2016-11-30 DIAGNOSIS — L02415 Cutaneous abscess of right lower limb: Secondary | ICD-10-CM | POA: Diagnosis present

## 2016-11-30 DIAGNOSIS — L02419 Cutaneous abscess of limb, unspecified: Secondary | ICD-10-CM | POA: Diagnosis present

## 2016-11-30 DIAGNOSIS — Z881 Allergy status to other antibiotic agents status: Secondary | ICD-10-CM

## 2016-11-30 DIAGNOSIS — L0390 Cellulitis, unspecified: Secondary | ICD-10-CM | POA: Diagnosis present

## 2016-11-30 DIAGNOSIS — L0291 Cutaneous abscess, unspecified: Secondary | ICD-10-CM

## 2016-11-30 DIAGNOSIS — Z789 Other specified health status: Secondary | ICD-10-CM

## 2016-11-30 DIAGNOSIS — I891 Lymphangitis: Secondary | ICD-10-CM

## 2016-11-30 DIAGNOSIS — L97819 Non-pressure chronic ulcer of other part of right lower leg with unspecified severity: Secondary | ICD-10-CM | POA: Diagnosis present

## 2016-11-30 DIAGNOSIS — D649 Anemia, unspecified: Secondary | ICD-10-CM | POA: Diagnosis present

## 2016-11-30 DIAGNOSIS — E871 Hypo-osmolality and hyponatremia: Secondary | ICD-10-CM | POA: Diagnosis present

## 2016-11-30 DIAGNOSIS — R21 Rash and other nonspecific skin eruption: Secondary | ICD-10-CM | POA: Diagnosis present

## 2016-11-30 DIAGNOSIS — L03119 Cellulitis of unspecified part of limb: Secondary | ICD-10-CM

## 2016-11-30 DIAGNOSIS — L0292 Furuncle, unspecified: Secondary | ICD-10-CM | POA: Diagnosis present

## 2016-11-30 DIAGNOSIS — B9562 Methicillin resistant Staphylococcus aureus infection as the cause of diseases classified elsewhere: Secondary | ICD-10-CM | POA: Diagnosis present

## 2016-11-30 DIAGNOSIS — L03115 Cellulitis of right lower limb: Principal | ICD-10-CM | POA: Diagnosis present

## 2016-11-30 DIAGNOSIS — Z87891 Personal history of nicotine dependence: Secondary | ICD-10-CM

## 2016-11-30 LAB — CBC WITH DIFFERENTIAL/PLATELET
BASOS PCT: 1 %
Basophils Absolute: 0 10*3/uL (ref 0.0–0.1)
EOS ABS: 0.3 10*3/uL (ref 0.0–0.7)
EOS PCT: 5 %
HCT: 40.3 % (ref 39.0–52.0)
HEMOGLOBIN: 13.5 g/dL (ref 13.0–17.0)
LYMPHS ABS: 0.9 10*3/uL (ref 0.7–4.0)
Lymphocytes Relative: 15 %
MCH: 29.9 pg (ref 26.0–34.0)
MCHC: 33.5 g/dL (ref 30.0–36.0)
MCV: 89.2 fL (ref 78.0–100.0)
Monocytes Absolute: 0.4 10*3/uL (ref 0.1–1.0)
Monocytes Relative: 7 %
NEUTROS PCT: 72 %
Neutro Abs: 4.1 10*3/uL (ref 1.7–7.7)
PLATELETS: 214 10*3/uL (ref 150–400)
RBC: 4.52 MIL/uL (ref 4.22–5.81)
RDW: 13.2 % (ref 11.5–15.5)
WBC: 5.6 10*3/uL (ref 4.0–10.5)

## 2016-11-30 LAB — BASIC METABOLIC PANEL
Anion gap: 8 (ref 5–15)
BUN: 7 mg/dL (ref 6–20)
CALCIUM: 8.5 mg/dL — AB (ref 8.9–10.3)
CO2: 24 mmol/L (ref 22–32)
CREATININE: 1.01 mg/dL (ref 0.61–1.24)
Chloride: 101 mmol/L (ref 101–111)
Glucose, Bld: 133 mg/dL — ABNORMAL HIGH (ref 65–99)
Potassium: 3.8 mmol/L (ref 3.5–5.1)
SODIUM: 133 mmol/L — AB (ref 135–145)

## 2016-11-30 LAB — I-STAT CG4 LACTIC ACID, ED: LACTIC ACID, VENOUS: 1.31 mmol/L (ref 0.5–1.9)

## 2016-11-30 MED ORDER — VANCOMYCIN HCL IN DEXTROSE 1-5 GM/200ML-% IV SOLN
1000.0000 mg | Freq: Once | INTRAVENOUS | Status: AC
  Administered 2016-11-30: 1000 mg via INTRAVENOUS
  Filled 2016-11-30: qty 200

## 2016-11-30 MED ORDER — ONDANSETRON HCL 4 MG PO TABS: 4.0000 mg | ORAL_TABLET | Freq: Four times a day (QID) | ORAL | Status: DC | PRN

## 2016-11-30 MED ORDER — SODIUM CHLORIDE 0.9 % IV SOLN
INTRAVENOUS | Status: AC
  Administered 2016-11-30: via INTRAVENOUS

## 2016-11-30 MED ORDER — SODIUM CHLORIDE 0.9 % IV BOLUS (SEPSIS)
1000.0000 mL | Freq: Once | INTRAVENOUS | Status: AC
  Administered 2016-11-30: 1000 mL via INTRAVENOUS

## 2016-11-30 MED ORDER — ONDANSETRON HCL 4 MG/2ML IJ SOLN: 4.0000 mg | Freq: Four times a day (QID) | INTRAMUSCULAR | Status: DC | PRN

## 2016-11-30 MED ORDER — VANCOMYCIN HCL IN DEXTROSE 1-5 GM/200ML-% IV SOLN
1000.0000 mg | Freq: Two times a day (BID) | INTRAVENOUS | Status: DC
  Administered 2016-12-01: 1000 mg via INTRAVENOUS
  Filled 2016-11-30 (×3): qty 200

## 2016-11-30 MED ORDER — ACETAMINOPHEN 650 MG RE SUPP: 650.0000 mg | Freq: Four times a day (QID) | RECTAL | Status: DC | PRN

## 2016-11-30 MED ORDER — KETOROLAC TROMETHAMINE 30 MG/ML IJ SOLN
30.0000 mg | Freq: Four times a day (QID) | INTRAMUSCULAR | Status: DC | PRN
  Administered 2016-12-01 – 2016-12-03 (×2): 30 mg via INTRAVENOUS
  Filled 2016-11-30: qty 1

## 2016-11-30 MED ORDER — HYDROCODONE-ACETAMINOPHEN 5-325 MG PO TABS
1.0000 | ORAL_TABLET | ORAL | Status: DC | PRN
  Administered 2016-12-02 (×2): 2 via ORAL
  Administered 2016-12-02: 1 via ORAL
  Administered 2016-12-03 – 2016-12-04 (×4): 2 via ORAL
  Filled 2016-11-30: qty 1
  Filled 2016-11-30 (×3): qty 2
  Filled 2016-11-30 (×2): qty 1
  Filled 2016-11-30 (×2): qty 2

## 2016-11-30 MED ORDER — POLYETHYLENE GLYCOL 3350 17 G PO PACK: 17.0000 g | PACK | Freq: Every day | ORAL | Status: DC | PRN

## 2016-11-30 MED ORDER — ACETAMINOPHEN 325 MG PO TABS
650.0000 mg | ORAL_TABLET | Freq: Four times a day (QID) | ORAL | Status: DC | PRN
  Administered 2016-12-01: 650 mg via ORAL
  Filled 2016-11-30: qty 2

## 2016-11-30 MED ORDER — ENOXAPARIN SODIUM 40 MG/0.4ML ~~LOC~~ SOLN: 40.0000 mg | SUBCUTANEOUS | Status: DC

## 2016-11-30 MED ORDER — ONDANSETRON HCL 4 MG/2ML IJ SOLN
4.0000 mg | Freq: Once | INTRAMUSCULAR | Status: AC
Start: 2016-11-30 — End: 2016-11-30
  Administered 2016-11-30: 4 mg via INTRAVENOUS
  Filled 2016-11-30: qty 2

## 2016-11-30 MED ORDER — MORPHINE SULFATE (PF) 4 MG/ML IV SOLN
4.0000 mg | Freq: Once | INTRAVENOUS | Status: AC
Start: 2016-11-30 — End: 2016-11-30
  Administered 2016-11-30: 4 mg via INTRAVENOUS
  Filled 2016-11-30: qty 1

## 2016-11-30 MED ORDER — LIDOCAINE-EPINEPHRINE (PF) 2 %-1:200000 IJ SOLN
10.0000 mL | Freq: Once | INTRAMUSCULAR | Status: AC
  Administered 2016-11-30: 10 mL
  Filled 2016-11-30: qty 20

## 2016-11-30 NOTE — Progress Notes (Signed)
Pharmacy Antibiotic Note  Nathaniel Zuniga is a 41 y.o. male admitted on 11/30/2016 with cellulitis.  Pt was taking Bactrim PTA but sx have worsened. Pharmacy has been consulted for vancomycin dosing. Patient is afebrile, WBC wnl, CrCl ~ 88 ml/min  S/p vanc 1g in ED  Plan: Vancomycin 1g IV every 12 hours.  Goal trough 10-15 mcg/mL.  Monitor renal function, cultures, vanc trough at steady state  Height: 5\' 6"  (167.6 cm) Weight: 150 lb (68 kg) IBW/kg (Calculated) : 63.8  Temp (24hrs), Avg:99.5 F (37.5 C), Min:99.5 F (37.5 C), Max:99.5 F (37.5 C)   Recent Labs Lab 11/30/16 1908 11/30/16 1915  WBC 5.6  --   CREATININE 1.01  --   LATICACIDVEN  --  1.31    Estimated Creatinine Clearance: 88.6 mL/min (by C-G formula based on SCr of 1.01 mg/dL).    Allergies  Allergen Reactions  . Bactrim [Sulfamethoxazole-Trimethoprim] Itching, Dermatitis and Rash    Antimicrobials this admission: 5/23 vanc >>   Dose adjustments this admission: n/a  Microbiology results: 5/23 BCx:   Thank you for allowing pharmacy to be a part of this patient's care.   [name], PharmD PGY1 Pharmacy Resident Rx ED [phone]#[id number] 11/30/2016 9:14 PM

## 2016-11-30 NOTE — ED Triage Notes (Signed)
Pt sts cellulitis to right leg that is being treated with antibiotics; pt sts only 3 doses left with some improvement; redness still noted to leg; pt sts irritated skin in general and some redness to ears; appears to have hx of sepsis but pt denies

## 2016-11-30 NOTE — H&P (Signed)
History and Physical    Nathaniel BallerDion Q Shambley ZOX:[id number] DOB: 11/03/1975 DOA: 11/30/2016  PCP: Patient, No Pcp Per   Patient coming from: Home  Chief Complaint: Fevers, right leg redness, swelling, pain, drainage  HPI: Nathaniel Zuniga is a 41 y.o. male who denies any significant past medical history, presented to the emergency department for evaluation of fevers and pain, redness, swelling, and drainage from the right leg. Patient reports that he had been in his usual state of health until approximately 2 weeks ago when he noted a small red bump on his anterior right shin. Over the ensuing days, the erythema expanded and the patient developed pain at the site. By one week ago, he was developing fevers and was evaluated at an outside hospital where he was prescribed Bactrim. He reports continued adherence with the Bactrim, which seemed to help initially with reduced pain, swelling, and erythema. The past 3 days or so, patient reports worsening with erythema now involving most of the lower leg, severe pain, spontaneous drainage, and fevers with chills. He denies any history of skin or soft tissue infections in the past, denies history of diabetes, and denies any use of illicit substances.  ED Course: Upon arrival to the ED, patient is found to be afebrile, saturating well on room air, and with vital signs stable. Chemistry panels notable for mild hyponatremia and CBC is unremarkable. Lactic acid is reassuring at 1.31. Abscess was incised and drained in the ED and the patient was treated with a liter of normal saline, 4 mg IV morphine, and empiric vancomycin. He remains hemodynamically stable and in no apparent respiratory distress, but will be admitted to the medical/surgical unit for ongoing evaluation and management of cellulitis with abscess, with fevers and worsening despite appropriate outpatient antibiotic.  Review of Systems:  All other systems reviewed and apart from HPI, are negative.  Past Medical  History:  Diagnosis Date  . Acute respiratory failure (HCC)   . Elevated liver enzymes   . Leukocytosis   . SIRS (systemic inflammatory response syndrome) (HCC)   . Tachycardia   . Tobacco abuse   . Volume depletion     Past Surgical History:  Procedure Laterality Date  . broken nose repair       reports that he quit smoking about 5 years ago. His smoking use included Cigarettes. He has a 18.00 pack-year smoking history. He does not have any smokeless tobacco history on file. He reports that he drinks alcohol. He reports that he uses drugs.  Allergies  Allergen Reactions  . Bactrim [Sulfamethoxazole-Trimethoprim] Itching, Dermatitis and Rash    Family History  Problem Relation Age of Onset  . Coronary artery disease Other      Prior to Admission medications   Medication Sig Start Date End Date Taking? Authorizing Provider  ibuprofen (ADVIL,MOTRIN) 200 MG tablet Take 200-800 mg by mouth every 6 (six) hours as needed for moderate pain.   Yes [provider]  loratadine (CLARITIN) 10 MG tablet Take 10 mg by mouth daily.   Yes [provider]  sulfamethoxazole-trimethoprim (BACTRIM DS,SEPTRA DS) 800-160 MG tablet Take 1 tablet by mouth 2 (two) times daily. For ten days   Yes [provider]  furosemide (LASIX) 20 MG tablet Take 20 mg by mouth. For seven days    [provider]    Physical Exam: Vitals:   11/30/16 1900 11/30/16 1915 11/30/16 1930 11/30/16 2000  BP:  115/73 117/72 119/80  Pulse: 72 64 61  Resp: 11 11 17    Temp:      TempSrc:      SpO2: 100% 100% 99%       Constitutional: NAD, calm, in apparent discomfort Eyes: PERTLA, lids and conjunctivae normal ENMT: Mucous membranes are moist. Posterior pharynx clear of any exudate or lesions.   Neck: normal, supple, no masses, no thyromegaly Respiratory: clear to auscultation bilaterally, no wheezing, no crackles. Normal respiratory effort.  Cardiovascular: S1 & S2 heard,  regular rate and rhythm. No significant JVD. Abdomen: No distension, no tenderness, no masses palpated. Bowel sounds normal.  Musculoskeletal: no clubbing / cyanosis. No joint deformity upper and lower extremities. Normal muscle tone.  Skin: Anterior right shin with abscess s/p I&D on background of intense erythema, swelling, and tenderness; no odor appreciated. Skin is otherwise warm, dry, well-perfused. Neurologic: CN 2-12 grossly intact. Sensation intact, DTR normal. Strength 5/5 in all 4 limbs.  Psychiatric: Alert and oriented x 3. Pleasant and cooperative.     Labs on Admission: I have personally reviewed following labs and imaging studies  CBC:  Recent Labs Lab 11/30/16 1908  WBC 5.6  NEUTROABS 4.1  HGB 13.5  HCT 40.3  MCV 89.2  PLT 214   Basic Metabolic Panel:  Recent Labs Lab 11/30/16 1908  NA 133*  K 3.8  CL 101  CO2 24  GLUCOSE 133*  BUN 7  CREATININE 1.01  CALCIUM 8.5*   GFR: CrCl cannot be calculated (Unknown ideal weight.). Liver Function Tests: No results for input(s): AST, ALT, ALKPHOS, BILITOT, PROT, ALBUMIN in the last 168 hours. No results for input(s): LIPASE, AMYLASE in the last 168 hours. No results for input(s): AMMONIA in the last 168 hours. Coagulation Profile: No results for input(s): INR, PROTIME in the last 168 hours. Cardiac Enzymes: No results for input(s): CKTOTAL, CKMB, CKMBINDEX, TROPONINI in the last 168 hours. BNP (last 3 results) No results for input(s): PROBNP in the last 8760 hours. HbA1C: No results for input(s): HGBA1C in the last 72 hours. CBG: No results for input(s): GLUCAP in the last 168 hours. Lipid Profile: No results for input(s): CHOL, HDL, LDLCALC, TRIG, CHOLHDL, LDLDIRECT in the last 72 hours. Thyroid Function Tests: No results for input(s): TSH, T4TOTAL, FREET4, T3FREE, THYROIDAB in the last 72 hours. Anemia Panel: No results for input(s): VITAMINB12, FOLATE, FERRITIN, TIBC, IRON, RETICCTPCT in the last 72  hours. Urine analysis:    Component Value Date/Time   COLORURINE YELLOW 06/12/2011 1812   APPEARANCEUR CLEAR 06/12/2011 1812   LABSPEC 1.012 06/12/2011 1812   PHURINE 6.5 06/12/2011 1812   GLUCOSEU NEGATIVE 06/12/2011 1812   HGBUR SMALL (A) 06/12/2011 1812   BILIRUBINUR NEGATIVE 06/12/2011 1812   KETONESUR NEGATIVE 06/12/2011 1812   PROTEINUR 30 (A) 06/12/2011 1812   UROBILINOGEN 0.2 06/12/2011 1812   NITRITE NEGATIVE 06/12/2011 1812   LEUKOCYTESUR NEGATIVE 06/12/2011 1812   Sepsis Labs: @LABRCNTIP (procalcitonin:4,lacticidven:4) )No results found for this or any previous visit (from the past 240 hour(s)).   Radiological Exams on Admission: No results found.  EKG: Not performed.   Assessment/Plan  1. Cellulitis with abscess, RLE  - Pt presents with abscess at anterior right shin, surrounding cellulitis, fevers at home, and overall worsening despite treatment with Bactrim  - Afebrile here with no leukocytosis and reassuring lactic acid  - Scant spontaneous drainage from abscess noted, now s/p I&D in ED  - Blood cultures obtained in ED and empiric vancomycin started  - Given continued worsening despite outpatient treatment with Bactrim, and despite  the abscess draining spontaneously, will observe in hospital with IV vancomycin     DVT prophylaxis: sq Lovenox Code Status: Full  Family Communication: Discussed with patient Disposition Plan: Observe on med-surg Consults called: None Admission status: Observation    Briscoe Deutscher, MD [facility] Pager [phone]  If 7PM-7AM, please contact night-coverage [email or web address] Password [facility]  11/30/2016, 8:53 PM

## 2016-11-30 NOTE — ED Provider Notes (Signed)
[facility]-EMERGENCY DEPT Provider Note   CSN: [id number] Arrival date & time: 11/30/16  1641     History   Chief Complaint Chief Complaint  Patient presents with  . Leg Pain  . Rash    HPI Nathaniel Zuniga is a 41 y.o. male.  Pt presents to the ED today with redness and swelling to her right leg.  Pt said that he did see a doctor last week at an outside hospital and was put on antibiotics (Bactrim).  He is almost done with his course of antibiotics.  He said sx are worse.  Pt said it started with a bump to his right leg and has spread.      Past Medical History:  Diagnosis Date  . Acute respiratory failure (HCC)   . Elevated liver enzymes   . Leukocytosis   . SIRS (systemic inflammatory response syndrome) (HCC)   . Tachycardia   . Tobacco abuse   . Volume depletion     Patient Active Problem List   Diagnosis Date Noted  . Elevated liver enzymes 06/17/2011  . Bilateral pneumonia 06/10/2011  . Tobacco abuse 06/10/2011    Past Surgical History:  Procedure Laterality Date  . broken nose repair         Home Medications    Prior to Admission medications   Not on File    Family History Family History  Problem Relation Age of Onset  . Coronary artery disease Other     Social History Social History  Substance Use Topics  . Smoking status: Former Smoker    Packs/day: 1.00    Years: 18.00    Types: Cigarettes    Quit date: 06/09/2011  . Smokeless tobacco: Not on file  . Alcohol use Yes     Allergies   Patient has no known allergies.   Review of Systems Review of Systems  Skin: Positive for rash.  All other systems reviewed and are negative.    Physical Exam Updated Vital Signs BP 119/80   Pulse 61   Temp 99.5 F (37.5 C) (Oral)   Resp 17   SpO2 99%   Physical Exam  Constitutional: He is oriented to person, place, and time. He appears well-developed and well-nourished.  HENT:  Head: Normocephalic and atraumatic.  Right Ear: External ear  normal.  Left Ear: External ear normal.  Nose: Nose normal.  Mouth/Throat: Oropharynx is clear and moist.  Eyes: Conjunctivae and EOM are normal. Pupils are equal, round, and reactive to light.  Neck: Normal range of motion. Neck supple.  Cardiovascular: Normal rate, regular rhythm, normal heart sounds and intact distal pulses.   Pulmonary/Chest: Effort normal and breath sounds normal.  Abdominal: Soft. Bowel sounds are normal.  Musculoskeletal:  Right anterior lower leg with abscess and cellulitis.  Lymphangitis going up right leg.  LN in groin.  Neurological: He is alert and oriented to person, place, and time.  Nursing note and vitals reviewed.    ED Treatments / Results  Labs (all labs ordered are listed, but only abnormal results are displayed) Labs Reviewed  BASIC METABOLIC PANEL - Abnormal; Notable for the following:       Result Value   Sodium 133 (*)    Glucose, Bld 133 (*)    Calcium 8.5 (*)    All other components within normal limits  CBC WITH DIFFERENTIAL/PLATELET  I-STAT CG4 LACTIC ACID, ED    EKG  EKG Interpretation None       Radiology No results  found.  Procedures .Marland KitchenIncision and Drainage Date/Time: 11/30/2016 8:18 PM Performed by: Jacalyn Lefevre Authorized by: Jacalyn Lefevre   Consent:    Consent obtained:  Verbal   Consent given by:  Patient   Risks discussed:  Bleeding, incomplete drainage and pain   Alternatives discussed:  No treatment Location:    Type:  Abscess   Size:  1 cm by 1 cm   Location:  Lower extremity   Lower extremity location:  Leg   Leg location:  R lower leg Pre-procedure details:    Skin preparation:  Betadine Anesthesia (see MAR for exact dosages):    Anesthesia method:  Local infiltration   Local anesthetic:  Lidocaine 2% WITH epi Procedure type:    Complexity:  Simple Procedure details:    Incision types:  Cruciate   Scalpel blade:  11   Wound management:  Probed and deloculated   Drainage:  Purulent    Drainage amount:  Scant   Wound treatment:  Wound left open   Packing materials:  None Post-procedure details:    Patient tolerance of procedure:  Tolerated well, no immediate complications   (including critical care time)  Medications Ordered in ED Medications  sodium chloride 0.9 % bolus 1,000 mL (1,000 mLs Intravenous New Bag/Given 11/30/16 1930)  morphine 4 MG/ML injection 4 mg (4 mg Intravenous Given 11/30/16 1914)  ondansetron (ZOFRAN) injection 4 mg (4 mg Intravenous Given 11/30/16 1913)  vancomycin (VANCOCIN) IVPB 1000 mg/200 mL premix (1,000 mg Intravenous New Bag/Given 11/30/16 1930)  lidocaine-EPINEPHrine (XYLOCAINE W/EPI) 2 %-1:200000 (PF) injection 10 mL (10 mLs Infiltration Given by Other 11/30/16 1916)     Initial Impression / Assessment and Plan / ED Course  I have reviewed the triage vital signs and the nursing notes.  Pertinent labs & imaging results that were available during my care of the patient were reviewed by me and considered in my medical decision making (see chart for details).    Pt has worsened on bactrim.  He was given vancomycin and IVFs and analgesics.  He was d/w Dr. Antionette Char for admission.  Final Clinical Impressions(s) / ED Diagnoses   Final diagnoses:  Cellulitis of right lower extremity  Lymphangitis  Failure of outpatient treatment    New Prescriptions New Prescriptions   No medications on file     Jacalyn Lefevre, MD 11/30/16 2032

## 2016-12-01 ENCOUNTER — Observation Stay (HOSPITAL_COMMUNITY): Payer: Self-pay

## 2016-12-01 ENCOUNTER — Encounter (HOSPITAL_COMMUNITY)
Admission: EM | Disposition: A | Discharge status: Home / Self Care | Payer: Self-pay | Source: Home / Self Care | Attending: Internal Medicine

## 2016-12-01 ENCOUNTER — Inpatient Hospital Stay (HOSPITAL_COMMUNITY): Payer: Self-pay | Admitting: Certified Registered"

## 2016-12-01 ENCOUNTER — Encounter (HOSPITAL_COMMUNITY): Payer: Self-pay | Admitting: Anesthesiology

## 2016-12-01 DIAGNOSIS — L03119 Cellulitis of unspecified part of limb: Secondary | ICD-10-CM

## 2016-12-01 DIAGNOSIS — L02415 Cutaneous abscess of right lower limb: Secondary | ICD-10-CM | POA: Diagnosis present

## 2016-12-01 DIAGNOSIS — L03115 Cellulitis of right lower limb: Secondary | ICD-10-CM

## 2016-12-01 DIAGNOSIS — L02419 Cutaneous abscess of limb, unspecified: Secondary | ICD-10-CM

## 2016-12-01 HISTORY — PX: I&D EXTREMITY: SHX5045

## 2016-12-01 LAB — BASIC METABOLIC PANEL
Anion gap: 6 (ref 5–15)
BUN: 9 mg/dL (ref 6–20)
CALCIUM: 8 mg/dL — AB (ref 8.9–10.3)
CO2: 24 mmol/L (ref 22–32)
CREATININE: 1.05 mg/dL (ref 0.61–1.24)
Chloride: 105 mmol/L (ref 101–111)
GFR calc non Af Amer: >60 mL/min (ref >60)
Glucose, Bld: 118 mg/dL — ABNORMAL HIGH (ref 65–99)
Potassium: 4.3 mmol/L (ref 3.5–5.1)
SODIUM: 135 mmol/L (ref 135–145)

## 2016-12-01 LAB — SURGICAL PCR SCREEN
MRSA, PCR: NEGATIVE
STAPHYLOCOCCUS AUREUS: NEGATIVE

## 2016-12-01 LAB — CBC
HEMATOCRIT: 37 % — AB (ref 39.0–52.0)
HEMATOCRIT: 39.3 % (ref 39.0–52.0)
HEMOGLOBIN: 12.8 g/dL — AB (ref 13.0–17.0)
Hemoglobin: 12.3 g/dL — ABNORMAL LOW (ref 13.0–17.0)
MCH: 29.6 pg (ref 26.0–34.0)
MCH: 29.8 pg (ref 26.0–34.0)
MCHC: 33.2 g/dL (ref 30.0–36.0)
MCHC: 32.6 g/dL (ref 30.0–36.0)
MCV: 89.2 fL (ref 78.0–100.0)
MCV: 91.4 fL (ref 78.0–100.0)
Platelets: 177 10*3/uL (ref 150–400)
Platelets: 210 10*3/uL (ref 150–400)
RBC: 4.15 MIL/uL — ABNORMAL LOW (ref 4.22–5.81)
RBC: 4.3 MIL/uL (ref 4.22–5.81)
RDW: 13.3 % (ref 11.5–15.5)
RDW: 13.8 % (ref 11.5–15.5)
WBC: 3.9 10*3/uL — ABNORMAL LOW (ref 4.0–10.5)
WBC: 4.9 10*3/uL (ref 4.0–10.5)

## 2016-12-01 LAB — HIV ANTIBODY (ROUTINE TESTING W REFLEX): HIV Screen 4th Generation wRfx: NONREACTIVE

## 2016-12-01 SURGERY — IRRIGATION AND DEBRIDEMENT EXTREMITY
Anesthesia: General | Site: Leg Lower | Laterality: Right

## 2016-12-01 MED ORDER — METOCLOPRAMIDE HCL 5 MG/ML IJ SOLN: 5.0000 mg | Freq: Three times a day (TID) | INTRAMUSCULAR | Status: DC | PRN

## 2016-12-01 MED ORDER — PROPOFOL 10 MG/ML IV BOLUS
INTRAVENOUS | Status: AC
  Filled 2016-12-01: qty 20

## 2016-12-01 MED ORDER — 0.9 % SODIUM CHLORIDE (POUR BTL) OPTIME
TOPICAL | Status: DC | PRN
  Administered 2016-12-01: 1000 mL

## 2016-12-01 MED ORDER — PIPERACILLIN-TAZOBACTAM 3.375 G IVPB
3.3750 g | Freq: Three times a day (TID) | INTRAVENOUS | Status: DC
  Administered 2016-12-01 – 2016-12-03 (×7): 3.375 g via INTRAVENOUS
  Filled 2016-12-01 (×8): qty 50

## 2016-12-01 MED ORDER — LIDOCAINE HCL (CARDIAC) 20 MG/ML IV SOLN
INTRAVENOUS | Status: DC | PRN
  Administered 2016-12-01: 60 mg via INTRATRACHEAL

## 2016-12-01 MED ORDER — FENTANYL CITRATE (PF) 250 MCG/5ML IJ SOLN
INTRAMUSCULAR | Status: DC | PRN
  Administered 2016-12-01 (×3): 50 ug via INTRAVENOUS

## 2016-12-01 MED ORDER — OXYCODONE HCL 5 MG/5ML PO SOLN: 5.0000 mg | Freq: Once | ORAL | Status: DC | PRN

## 2016-12-01 MED ORDER — ONDANSETRON HCL 4 MG/2ML IJ SOLN: 4.0000 mg | Freq: Four times a day (QID) | INTRAMUSCULAR | Status: DC | PRN

## 2016-12-01 MED ORDER — VANCOMYCIN HCL IN DEXTROSE 1-5 GM/200ML-% IV SOLN
1000.0000 mg | Freq: Two times a day (BID) | INTRAVENOUS | Status: DC
  Administered 2016-12-02 – 2016-12-04 (×6): 1000 mg via INTRAVENOUS
  Filled 2016-12-01 (×6): qty 200

## 2016-12-01 MED ORDER — ONDANSETRON HCL 4 MG/2ML IJ SOLN
INTRAMUSCULAR | Status: DC | PRN
  Administered 2016-12-01: 4 mg via INTRAVENOUS

## 2016-12-01 MED ORDER — DIPHENHYDRAMINE HCL 50 MG/ML IJ SOLN
25.0000 mg | Freq: Once | INTRAMUSCULAR | Status: AC
  Administered 2016-12-02: 25 mg via INTRAVENOUS
  Filled 2016-12-01: qty 1

## 2016-12-01 MED ORDER — HYDROMORPHONE HCL 1 MG/ML IJ SOLN
0.2500 mg | INTRAMUSCULAR | Status: DC | PRN
  Administered 2016-12-01 (×2): 0.5 mg via INTRAVENOUS

## 2016-12-01 MED ORDER — HYDROMORPHONE HCL 1 MG/ML IJ SOLN
INTRAMUSCULAR | Status: AC
  Filled 2016-12-01: qty 0.5

## 2016-12-01 MED ORDER — KETOROLAC TROMETHAMINE 30 MG/ML IJ SOLN
INTRAMUSCULAR | Status: AC
  Filled 2016-12-01: qty 1

## 2016-12-01 MED ORDER — ACETAMINOPHEN 650 MG RE SUPP: 650.0000 mg | Freq: Four times a day (QID) | RECTAL | Status: DC | PRN

## 2016-12-01 MED ORDER — ACETAMINOPHEN 325 MG PO TABS: 650.0000 mg | ORAL_TABLET | Freq: Four times a day (QID) | ORAL | Status: DC | PRN

## 2016-12-01 MED ORDER — ENOXAPARIN SODIUM 40 MG/0.4ML ~~LOC~~ SOLN
40.0000 mg | SUBCUTANEOUS | Status: DC
Start: 2016-12-02 — End: 2016-12-03
  Filled 2016-12-01 (×2): qty 0.4

## 2016-12-01 MED ORDER — SODIUM CHLORIDE 0.9 % IR SOLN
Status: DC | PRN
Start: 2016-12-01 — End: 2016-12-01
  Administered 2016-12-01: 3000 mL

## 2016-12-01 MED ORDER — FENTANYL CITRATE (PF) 250 MCG/5ML IJ SOLN
INTRAMUSCULAR | Status: AC
  Filled 2016-12-01: qty 5

## 2016-12-01 MED ORDER — MIDAZOLAM HCL 5 MG/5ML IJ SOLN
INTRAMUSCULAR | Status: DC | PRN
  Administered 2016-12-01: 2 mg via INTRAVENOUS

## 2016-12-01 MED ORDER — METOCLOPRAMIDE HCL 5 MG PO TABS: 5.0000 mg | ORAL_TABLET | Freq: Three times a day (TID) | ORAL | Status: DC | PRN

## 2016-12-01 MED ORDER — MIDAZOLAM HCL 2 MG/2ML IJ SOLN
INTRAMUSCULAR | Status: AC
  Filled 2016-12-01: qty 2

## 2016-12-01 MED ORDER — OXYCODONE HCL 5 MG PO TABS: 5.0000 mg | ORAL_TABLET | Freq: Once | ORAL | Status: DC | PRN

## 2016-12-01 MED ORDER — PROPOFOL 10 MG/ML IV BOLUS
INTRAVENOUS | Status: DC | PRN
  Administered 2016-12-01: 130 mg via INTRAVENOUS

## 2016-12-01 MED ORDER — IOPAMIDOL (ISOVUE-300) INJECTION 61%
INTRAVENOUS | Status: AC
  Administered 2016-12-01: 100 mL via INTRAVENOUS
  Filled 2016-12-01: qty 100

## 2016-12-01 MED ORDER — LACTATED RINGERS IV SOLN
INTRAVENOUS | Status: DC | PRN
  Administered 2016-12-01 (×2): via INTRAVENOUS

## 2016-12-01 MED ORDER — ONDANSETRON HCL 4 MG PO TABS
4.0000 mg | ORAL_TABLET | Freq: Four times a day (QID) | ORAL | Status: DC | PRN
Start: 2016-12-01 — End: 2016-12-04

## 2016-12-01 MED ORDER — SODIUM CHLORIDE 0.9 % IV SOLN
INTRAVENOUS | Status: DC
  Administered 2016-12-01 – 2016-12-02 (×2): via INTRAVENOUS
  Administered 2016-12-03: 1 mL via INTRAVENOUS

## 2016-12-01 MED ORDER — SODIUM CHLORIDE 0.9 % IV SOLN: INTRAVENOUS | Status: DC

## 2016-12-01 SURGICAL SUPPLY — 68 items
BANDAGE ACE 4X5 VEL STRL LF (GAUZE/BANDAGES/DRESSINGS) ×6 IMPLANT
BANDAGE ELASTIC 4 VELCRO ST LF (GAUZE/BANDAGES/DRESSINGS) ×6 IMPLANT
BANDAGE ELASTIC 6 VELCRO ST LF (GAUZE/BANDAGES/DRESSINGS) ×3 IMPLANT
BLADE CLIPPER SURG (BLADE) ×3 IMPLANT
BNDG GAUZE ELAST 4 BULKY (GAUZE/BANDAGES/DRESSINGS) IMPLANT
BUCKET CAST 5QT PAPER WAX WHT (CAST SUPPLIES) ×3 IMPLANT
CUFF TOURNIQUET SINGLE 18IN (TOURNIQUET CUFF) IMPLANT
CUFF TOURNIQUET SINGLE 24IN (TOURNIQUET CUFF) IMPLANT
CUFF TOURNIQUET SINGLE 34IN LL (TOURNIQUET CUFF) ×3 IMPLANT
CUFF TOURNIQUET SINGLE 44IN (TOURNIQUET CUFF) IMPLANT
DRAPE SURG 17X23 STRL (DRAPES) IMPLANT
DRAPE U-SHAPE 47X51 STRL (DRAPES) ×3 IMPLANT
DRSG EMULSION OIL 3X3 NADH (GAUZE/BANDAGES/DRESSINGS) IMPLANT
DRSG PAD ABDOMINAL 8X10 ST (GAUZE/BANDAGES/DRESSINGS) ×3 IMPLANT
DURAPREP 26ML APPLICATOR (WOUND CARE) ×3 IMPLANT
ELECT CAUTERY BLADE 6.4 (BLADE) ×3 IMPLANT
ELECT REM PT RETURN 9FT ADLT (ELECTROSURGICAL) ×3
ELECTRODE REM PT RTRN 9FT ADLT (ELECTROSURGICAL) ×1 IMPLANT
FACESHIELD WRAPAROUND (MASK) ×6 IMPLANT
GAUZE SPONGE 4X4 12PLY STRL (GAUZE/BANDAGES/DRESSINGS) ×3 IMPLANT
GAUZE XEROFORM 5X9 LF (GAUZE/BANDAGES/DRESSINGS) ×3 IMPLANT
GLOVE BIOGEL PI IND STRL 7.5 (GLOVE) ×1 IMPLANT
GLOVE BIOGEL PI IND STRL 8 (GLOVE) ×1 IMPLANT
GLOVE BIOGEL PI INDICATOR 7.5 (GLOVE) ×2
GLOVE BIOGEL PI INDICATOR 8 (GLOVE) ×2
GLOVE ECLIPSE 8.0 STRL XLNG CF (GLOVE) ×3 IMPLANT
GLOVE ORTHO TXT STRL SZ7.5 (GLOVE) ×6 IMPLANT
GLOVE SURG ORTHO 8.0 STRL STRW (GLOVE) ×3 IMPLANT
GOWN STRL REIN 3XL XLG LVL4 (GOWN DISPOSABLE) IMPLANT
GOWN STRL REUS W/ TWL LRG LVL3 (GOWN DISPOSABLE) ×3 IMPLANT
GOWN STRL REUS W/TWL LRG LVL3 (GOWN DISPOSABLE) ×6
HANDPIECE INTERPULSE COAX TIP (DISPOSABLE) ×2
KIT BASIN OR (CUSTOM PROCEDURE TRAY) ×3 IMPLANT
KIT ROOM TURNOVER OR (KITS) ×3 IMPLANT
MANIFOLD NEPTUNE II (INSTRUMENTS) ×3 IMPLANT
NS IRRIG 1000ML POUR BTL (IV SOLUTION) ×3 IMPLANT
PACK ORTHO EXTREMITY (CUSTOM PROCEDURE TRAY) ×3 IMPLANT
PAD ABD 8X10 STRL (GAUZE/BANDAGES/DRESSINGS) ×3 IMPLANT
PAD ARMBOARD 7.5X6 YLW CONV (MISCELLANEOUS) ×6 IMPLANT
PAD CAST 4YDX4 CTTN HI CHSV (CAST SUPPLIES) ×2 IMPLANT
PADDING CAST ABS 3INX4YD NS (CAST SUPPLIES) ×2
PADDING CAST ABS 4INX4YD NS (CAST SUPPLIES) ×2
PADDING CAST ABS 6INX4YD NS (CAST SUPPLIES) ×2
PADDING CAST ABS COTTON 3X4 (CAST SUPPLIES) ×1 IMPLANT
PADDING CAST ABS COTTON 4X4 ST (CAST SUPPLIES) ×1 IMPLANT
PADDING CAST ABS COTTON 6X4 NS (CAST SUPPLIES) ×1 IMPLANT
PADDING CAST COTTON 4X4 STRL (CAST SUPPLIES) ×4
PADDING CAST COTTON 6X4 STRL (CAST SUPPLIES) ×3 IMPLANT
SET HNDPC FAN SPRY TIP SCT (DISPOSABLE) ×1 IMPLANT
SPLINT PLASTER CAST XFAST 5X30 (CAST SUPPLIES) ×1 IMPLANT
SPLINT PLASTER XFAST SET 5X30 (CAST SUPPLIES) ×2
SPONGE LAP 18X18 X RAY DECT (DISPOSABLE) ×3 IMPLANT
SPONGE LAP 4X18 X RAY DECT (DISPOSABLE) IMPLANT
STOCKINETTE IMPERVIOUS 9X36 MD (GAUZE/BANDAGES/DRESSINGS) ×3 IMPLANT
SUCTION FRAZIER HANDLE 10FR (MISCELLANEOUS)
SUCTION TUBE FRAZIER 10FR DISP (MISCELLANEOUS) IMPLANT
SUT ETHILON 2 0 FS 18 (SUTURE) ×9 IMPLANT
SUT ETHILON 3 0 PS 1 (SUTURE) IMPLANT
SUT SILK 2 0 FS (SUTURE) IMPLANT
SWAB COLLECTION DEVICE MRSA (MISCELLANEOUS) ×3 IMPLANT
SWAB CULTURE ESWAB REG 1ML (MISCELLANEOUS) ×3 IMPLANT
TOWEL OR 17X24 6PK STRL BLUE (TOWEL DISPOSABLE) IMPLANT
TOWEL OR 17X26 10 PK STRL BLUE (TOWEL DISPOSABLE) ×3 IMPLANT
TUBE CONNECTING 12'X1/4 (SUCTIONS) ×1
TUBE CONNECTING 12X1/4 (SUCTIONS) ×2 IMPLANT
UNDERPAD 30X30 (UNDERPADS AND DIAPERS) ×6 IMPLANT
WATER STERILE IRR 1000ML POUR (IV SOLUTION) IMPLANT
YANKAUER SUCT BULB TIP NO VENT (SUCTIONS) ×3 IMPLANT

## 2016-12-01 NOTE — Brief Op Note (Signed)
11/30/2016 - 12/01/2016  8:14 PM  PATIENT:  Debroah Ballerion Q Brailsford  41 y.o. male  PRE-OPERATIVE DIAGNOSIS:  right leg superficial abscess  POST-OPERATIVE DIAGNOSIS:  right leg superficial abscess  PROCEDURE:  Procedure(s): IRRIGATION AND DEBRIDEMENT LEG , POSSIBLE WOUND CLOSURE (Right)  SURGEON:  Surgeon(s) and Role:    Durene Romans* Mozel Burdett, MD - Primary  PHYSICIAN ASSISTANT: None   ANESTHESIA:   general  EBL:  minimal  BLOOD ADMINISTERED:none  DRAINS: none   LOCAL MEDICATIONS USED:  NONE  SPECIMEN:  Source of Specimen:  right leg abscess  DISPOSITION OF SPECIMEN:  PATHOLOGY  COUNTS:  YES  TOURNIQUET:  18 min at 250 mmHg  DICTATION: .Other Dictation: Dictation Number [id number]  PLAN OF CARE: Admit to inpatient   PATIENT DISPOSITION:  PACU - hemodynamically stable.   Delay start of Pharmacological VTE agent (>24hrs) due to surgical blood loss or risk of bleeding: no

## 2016-12-01 NOTE — Transfer of Care (Signed)
Immediate Anesthesia Transfer of Care Note  Patient: Nathaniel Zuniga  Procedure(s) Performed: Procedure(s): IRRIGATION AND DEBRIDEMENT LEG , POSSIBLE WOUND CLOSURE (Right)  Patient Location: PACU  Anesthesia Type:General  Level of Consciousness: awake  Airway & Oxygen Therapy: Patient Spontanous Breathing  Post-op Assessment: Report given to RN and Post -op Vital signs reviewed and stable  Post vital signs: Reviewed and stable  Last Vitals:  Vitals:   12/01/16 0529 12/01/16 1405  BP: (!) 108/59 129/71  Pulse: (!) 52 60  Resp: 17 18  Temp: 37.4 C 36.9 C    Last Pain:  Vitals:   12/01/16 1405  TempSrc: Oral  PainSc:          Complications: No apparent anesthesia complications

## 2016-12-01 NOTE — Care Management Note (Addendum)
Case Management Note  Patient Details  Name: Nathaniel Zuniga MRN: [id number] Date of Birth: 03/14/1976  Subjective/Objective:   From home, pta indep, presents with  cellulitis with abscess right leg, with fevers and worsening despite appropriate outpatient antibiotic.  [facility]   PCP listed none-  [facility]                 Action/Plan: NCM will follow for dc needs.   Expected Discharge Date:                  Expected Discharge Plan:  Home/Self Care  In-House Referral:     Discharge planning Services  CM Consult, Medication Assistance, Indigent Health Clinic  Post Acute Care Choice:    Choice offered to:     DME Arranged:    DME Agency:     HH Arranged:    HH Agency:     Status of Service:  In process, will continue to follow  If discussed at Long Length of Stay Meetings, dates discussed:    Additional Comments:  Leone Havenaylor, Anjuli Gemmill Clinton, RN 12/01/2016, 12:03 PM

## 2016-12-01 NOTE — Anesthesia Preprocedure Evaluation (Addendum)
Anesthesia Evaluation  Patient identified by MRN, date of birth, ID band Patient awake    Reviewed: Allergy & Precautions, NPO status , Patient's Chart, lab work & pertinent test results  History of Anesthesia Complications Negative for: history of anesthetic complications  Airway Mallampati: I  TM Distance: <3 FB Neck ROM: Full    Dental  (+) Teeth Intact, Dental Advisory Given,    Pulmonary former smoker,    breath sounds clear to auscultation       Cardiovascular negative cardio ROS   Rhythm:Regular     Neuro/Psych negative neurological ROS  negative psych ROS   GI/Hepatic negative GI ROS,   Endo/Other  negative endocrine ROS  Renal/GU negative Renal ROS     Musculoskeletal   Abdominal   Peds  Hematology  (+) anemia ,   Anesthesia Other Findings   Reproductive/Obstetrics                            Anesthesia Physical Anesthesia Plan  ASA: II  Anesthesia Plan: General   Post-op Pain Management:    Induction: Intravenous  Airway Management Planned: LMA  Additional Equipment: None  Intra-op Plan:   Post-operative Plan: Extubation in OR  Informed Consent: I have reviewed the patients History and Physical, chart, labs and discussed the procedure including the risks, benefits and alternatives for the proposed anesthesia with the patient or authorized representative who has indicated his/her understanding and acceptance.   Dental advisory given  Plan Discussed with: Anesthesiologist, CRNA and Surgeon  Anesthesia Plan Comments:        Anesthesia Quick Evaluation

## 2016-12-01 NOTE — Anesthesia Postprocedure Evaluation (Addendum)
Anesthesia Post Note  Patient: Nathaniel Zuniga  Procedure(s) Performed: Procedure(s) (LRB): IRRIGATION AND DEBRIDEMENT LEG , POSSIBLE WOUND CLOSURE (Right)  Patient location during evaluation: PACU Anesthesia Type: General Level of consciousness: awake and alert Pain management: pain level controlled Vital Signs Assessment: post-procedure vital signs reviewed and stable Respiratory status: spontaneous breathing, nonlabored ventilation, respiratory function stable and patient connected to nasal cannula oxygen Cardiovascular status: blood pressure returned to baseline and stable Postop Assessment: no signs of nausea or vomiting Anesthetic complications: no       Last Vitals:  Vitals:   12/01/16 2145 12/01/16 2215  BP: 119/80 127/72  Pulse: (!) 54 (!) 49  Resp: 14 16  Temp: 36.6 C 36.3 C    Last Pain:  Vitals:   12/01/16 2215  TempSrc: Oral  PainSc:                  Nathaniel Zuniga

## 2016-12-01 NOTE — Progress Notes (Signed)
Pt scheduled for an excision and drainage of his right leg abscess this evening but was reluctant to sign the consent. Will sign consent after speaking to MD.

## 2016-12-01 NOTE — Progress Notes (Signed)
Pharmacy Antibiotic Note  Nathaniel BallerDion Q Zuniga is a 41 y.o. male admitted on 11/30/2016 with cellulitis.  Pt was taking Bactrim PTA but sx have worsened. Pharmacy has been consulted for vancomycin and Zosyn dosing. Patient is afebrile, WBC wnl.  Plan: Vancomycin 1g IV every 12 hours.  Goal trough 10-15 mcg/mL.  Start Zosyn 3.375 gm IV q8h (4 hour infusion) Monitor clinical picture, renal function, VT prn F/U C&S, abx deescalation / LOT  Height: 5\' 6"  (167.6 cm) Weight: 150 lb (68 kg) IBW/kg (Calculated) : 63.8  Temp (24hrs), Avg:99.2 F (37.3 C), Min:98.8 F (37.1 C), Max:99.5 F (37.5 C)   Recent Labs Lab 11/30/16 1908 11/30/16 1915 12/01/16 0338  WBC 5.6  --  3.9*  CREATININE 1.01  --  1.05  LATICACIDVEN  --  1.31  --     Estimated Creatinine Clearance: 85.2 mL/min (by C-G formula based on SCr of 1.05 mg/dL).    Allergies  Allergen Reactions  . Bactrim [Sulfamethoxazole-Trimethoprim] Itching, Dermatitis and Rash    Antimicrobials this admission: Vancomycin 5/23 >> Zosyn 5/23 >>   Dose adjustments this admission: n/a  Microbiology results: 5/23 BCx: sent  Thank you for allowing pharmacy to be a part of this patient's care.  Enzo BiNathan Statia Burdick, PharmD, BCPS Clinical Pharmacist Pager [phone] 12/01/2016 9:46 AM

## 2016-12-01 NOTE — Consult Note (Signed)
Reason for Consult:RLE infection Referring Physician: D elgergawy  Nathaniel Zuniga is an 41 y.o. male.  HPI: Nathaniel Zuniga was in his usual state of health until about 2 weeks ago when he began to have some pain in his legs, starting with his left. His right shin developed a sore, redness, swelling, and he noted some inguinal LAD. He was seen for this and placed on abx. It improved slightly but then seemed to worsen again and he came for f/u. He received an I&D which returned a small amount of purulence. He was admitted and placed on vanc/Zosyn. Orthopedic surgery was consulted.  Past Medical History:  Diagnosis Date  . Acute respiratory failure ([place])   . Elevated liver enzymes   . Leukocytosis   . SIRS (systemic inflammatory response syndrome) ([facility])   . Tachycardia   . Tobacco abuse   . Volume depletion     Past Surgical History:  Procedure Laterality Date  . broken nose repair      Family History  Problem Relation Age of Onset  . Coronary artery disease Other     Social History:  reports that he quit smoking about 5 years ago. His smoking use included Cigarettes. He has a 18.00 pack-year smoking history. He does not have any smokeless tobacco history on file. He reports that he drinks alcohol. He reports that he uses drugs.  Allergies:  Allergies  Allergen Reactions  . Bactrim [Sulfamethoxazole-Trimethoprim] Itching, Dermatitis and Rash    Medications: I have reviewed the patient's current medications.  Results for orders placed or performed during the hospital encounter of 11/30/16 (from the past 48 hour(s))  Basic metabolic panel     Status: Abnormal   Collection Time: 11/30/16  7:08 PM  Result Value Ref Range   Sodium 133 (L) 135 - 145 mmol/L   Potassium 3.8 3.5 - 5.1 mmol/L   Chloride 101 101 - 111 mmol/L   CO2 24 22 - 32 mmol/L   Glucose, Bld 133 (H) 65 - 99 mg/dL   BUN 7 6 - 20 mg/dL   Creatinine, Ser 1.01 0.61 - 1.24 mg/dL   Calcium 8.5 (L) 8.9 - 10.3 mg/dL   GFR calc  non Af Amer >60 >60 mL/min   GFR calc Af Amer >60 >60 mL/min    Comment: (NOTE) The eGFR has been calculated using the CKD EPI equation. This calculation has not been validated in all clinical situations. eGFR's persistently <60 mL/min signify possible Chronic Kidney Disease.    Anion gap 8 5 - 15  CBC with Differential     Status: None   Collection Time: 11/30/16  7:08 PM  Result Value Ref Range   WBC 5.6 4.0 - 10.5 K/uL   RBC 4.52 4.22 - 5.81 MIL/uL   Hemoglobin 13.5 13.0 - 17.0 g/dL   HCT 40.3 39.0 - 52.0 %   MCV 89.2 78.0 - 100.0 fL   MCH 29.9 26.0 - 34.0 pg   MCHC 33.5 30.0 - 36.0 g/dL   RDW 13.2 11.5 - 15.5 %   Platelets 214 150 - 400 K/uL   Neutrophils Relative % 72 %   Neutro Abs 4.1 1.7 - 7.7 K/uL   Lymphocytes Relative 15 %   Lymphs Abs 0.9 0.7 - 4.0 K/uL   Monocytes Relative 7 %   Monocytes Absolute 0.4 0.1 - 1.0 K/uL   Eosinophils Relative 5 %   Eosinophils Absolute 0.3 0.0 - 0.7 K/uL   Basophils Relative 1 %   Basophils  Absolute 0.0 0.0 - 0.1 K/uL  I-Stat CG4 Lactic Acid, ED     Status: None   Collection Time: 11/30/16  7:15 PM  Result Value Ref Range   Lactic Acid, Venous 1.31 0.5 - 1.9 mmol/L  HIV antibody (Routine Testing)     Status: None   Collection Time: 12/01/16  3:38 AM  Result Value Ref Range   HIV Screen 4th Generation wRfx Non Reactive Non Reactive    Comment: (NOTE) Performed At: [facility] [place], [place] [id number] Lindon Romp MD [id number]   CBC     Status: Abnormal   Collection Time: 12/01/16  3:38 AM  Result Value Ref Range   WBC 3.9 (L) 4.0 - 10.5 K/uL   RBC 4.15 (L) 4.22 - 5.81 MIL/uL   Hemoglobin 12.3 (L) 13.0 - 17.0 g/dL   HCT 37.0 (L) 39.0 - 52.0 %   MCV 89.2 78.0 - 100.0 fL   MCH 29.6 26.0 - 34.0 pg   MCHC 33.2 30.0 - 36.0 g/dL   RDW 13.3 11.5 - 15.5 %   Platelets 177 150 - 400 K/uL  Basic metabolic panel     Status: Abnormal   Collection Time: 12/01/16  3:38 AM  Result Value Ref Range    Sodium 135 135 - 145 mmol/L   Potassium 4.3 3.5 - 5.1 mmol/L   Chloride 105 101 - 111 mmol/L   CO2 24 22 - 32 mmol/L   Glucose, Bld 118 (H) 65 - 99 mg/dL   BUN 9 6 - 20 mg/dL   Creatinine, Ser 1.05 0.61 - 1.24 mg/dL   Calcium 8.0 (L) 8.9 - 10.3 mg/dL   GFR calc non Af Amer >60 >60 mL/min   GFR calc Af Amer >60 >60 mL/min    Comment: (NOTE) The eGFR has been calculated using the CKD EPI equation. This calculation has not been validated in all clinical situations. eGFR's persistently <60 mL/min signify possible Chronic Kidney Disease.    Anion gap 6 5 - 15    No results found.  Review of Systems  Constitutional: Negative for weight loss.  HENT: Negative for ear discharge, ear pain, hearing loss and tinnitus.   Eyes: Negative for blurred vision, double vision, photophobia and pain.  Respiratory: Positive for shortness of breath. Negative for cough and sputum production.   Cardiovascular: Negative for chest pain.  Gastrointestinal: Negative for abdominal pain, nausea and vomiting.  Genitourinary: Negative for dysuria, flank pain, frequency and urgency.  Musculoskeletal: Positive for joint pain (RLE). Negative for back pain, falls, myalgias and neck pain.  Neurological: Negative for dizziness, tingling, sensory change, focal weakness, loss of consciousness and headaches.  Endo/Heme/Allergies: Does not bruise/bleed easily.  Psychiatric/Behavioral: Negative for depression, memory loss and substance abuse. The patient is not nervous/anxious.    Blood pressure 129/71, pulse 60, temperature 98.5 F (36.9 C), temperature source Oral, resp. rate 18, height _0  (1.676 m), weight 68 kg (150 lb), SpO2 97 %. Physical Exam  Constitutional: He appears well-developed and well-nourished. No distress.  HENT:  Head: Normocephalic.  Eyes: Conjunctivae are normal. Right eye exhibits no discharge. Left eye exhibits no discharge. No scleral icterus.  Cardiovascular: Normal rate and regular rhythm.    Respiratory: Effort normal. No respiratory distress.  Musculoskeletal:  RLE No traumatic wounds or ecchymosis. Erythema over anterior lower leg with 2 quarter-sized ulcerations. Nummular eczema noted on thigh.  Mild TTP shin  No effusions  Knee stable to varus/ valgus and anterior/posterior  stress  Sens DPN, SPN, TN intact  Motor EHL, ext, flex, evers 5/5  DP 2+, PT 2+, No significant edema   LLE No traumatic wounds, ecchymosis, nummular eczema noted on thigh  Nontender  No effusions  Knee stable to varus/ valgus and anterior/posterior stress  Sens DPN, SPN, TN intact  Motor EHL, ext, flex, evers 5/5  DP 2+, PT 2+, No significant edema  Neurological: He is alert.  Skin: Skin is warm and dry. He is not diaphoretic.  Psychiatric: He has a normal mood and affect. His behavior is normal.    Assessment/Plan: RLE cellulitis -- Dr. Alvan Dame to take pt to OR for I&D. Post-operative plan to depend on intraoperative findings. Continue current abx.    Lisette Abu, PA-C Orthopedic Surgery [phone] 12/01/2016, 3:11 PM

## 2016-12-01 NOTE — Progress Notes (Signed)
PROGRESS NOTE                                                                                                                                                                                                             Patient Demographics:    Nathaniel Zuniga, is a 41 y.o. male, DOB - 10/15/1975, ZOX:[id number]  Admit date - 11/30/2016   Admitting Physician Briscoe Deutscherimothy S Opyd, MD  Outpatient Primary MD for the patient is Patient, No Pcp Per  LOS - 0  Chief Complaint  Patient presents with  . Leg Pain  . Rash       Brief Narrative  41 year old male without significant acoustical history, presents with right leg abscess, not improving on oral Bactrim.   Subjective:    Nathaniel Zuniga today has, No headache, No chest pain, No abdominal pain -Reports some pain at the abscess site  Assessment  & Plan :    Principal Problem:   Cellulitis and abscess of leg   Cellulitis and abscess of right lower extremity - No improvement of Bactrim an outpatient setting, and with a spontaneous drainage, reports  fever at home, status post I&D in ED. - Continue to follow up with a blood cultures, he is empirically on vancomycin, will add IV Zosyn given no improvement of Bactrim an outpatient setting. - Patient with fluctuance below ulcerated area in the anterior shin, will obtain CT right lower extremity with contrast to rule out abscess, discussed with orthopedic Dr. Cathie Beamslin Ho will evaluate the patient today.  Code Status : full  Family Communication  : None at bedisde  Disposition Plan  : home when stable  Consults  :  Ortho Dr Charlann Boxerlin  Procedures  : None  DVT Prophylaxis  :  Lovenox -  SCDs   Lab Results  Component Value Date   PLT 177 12/01/2016    Antibiotics  :   Anti-infectives    Start     Dose/Rate Route Frequency Ordered Stop   12/01/16 1000  piperacillin-tazobactam (ZOSYN) IVPB 3.375 g     3.375 g 12.5 mL/hr over 240 Minutes Intravenous Every 8 hours  12/01/16 0931     12/01/16 0730  vancomycin (VANCOCIN) IVPB 1000 mg/200 mL premix     1,000 mg 200 mL/hr over 60 Minutes Intravenous Every 12 hours 11/30/16 2111  11/30/16 1900  vancomycin (VANCOCIN) IVPB 1000 mg/200 mL premix     1,000 mg 200 mL/hr over 60 Minutes Intravenous  Once 11/30/16 1857 11/30/16 2030        Objective:   Vitals:   11/30/16 2030 11/30/16 2100 11/30/16 2140 12/01/16 0529  BP: 112/70  (!) 116/54 (!) 108/59  Pulse: 63  60 (!) 52  Resp: 18  16 17   Temp:   98.8 F (37.1 C) 99.3 F (37.4 C)  TempSrc:   Oral Oral  SpO2: 98%  100% 100%  Weight:  68 kg (150 lb)    Height:  5\' 6"  (1.676 m)      Wt Readings from Last 3 Encounters:  11/30/16 68 kg (150 lb)  07/01/11 69.9 kg (154 lb)  06/30/11 68.5 kg (151 lb)     Intake/Output Summary (Last 24 hours) at 12/01/16 1215 Last data filed at 12/01/16 0500  Gross per 24 hour  Intake              960 ml  Output                0 ml  Net              960 ml     Physical Exam  Awake Alert, Oriented X 3,  Supple Neck,No JVD,  Symmetrical Chest wall movement, Good air movement bilaterally, CTAB RRR,No Gallops,Rubs or new Murmurs, No Parasternal Heave +ve B.Sounds, Abd Soft, No tenderness,  No rebound - guarding or rigidity. Anterior right shin abscess, with 2 ulcerated area, with some drainage, with intense erythema and cellulitis surrounding it, and some fluctuant material filled below these ulcers    Data Review:    CBC  Recent Labs Lab 11/30/16 1908 12/01/16 0338  WBC 5.6 3.9*  HGB 13.5 12.3*  HCT 40.3 37.0*  PLT 214 177  MCV 89.2 89.2  MCH 29.9 29.6  MCHC 33.5 33.2  RDW 13.2 13.3  LYMPHSABS 0.9  --   MONOABS 0.4  --   EOSABS 0.3  --   BASOSABS 0.0  --     Chemistries   Recent Labs Lab 11/30/16 1908 12/01/16 0338  NA 133* 135  K 3.8 4.3  CL 101 105  CO2 24 24  GLUCOSE 133* 118*  BUN 7 9  CREATININE 1.01 1.05  CALCIUM 8.5* 8.0*    ------------------------------------------------------------------------------------------------------------------ No results for input(s): CHOL, HDL, LDLCALC, TRIG, CHOLHDL, LDLDIRECT in the last 72 hours.  No results found for: HGBA1C ------------------------------------------------------------------------------------------------------------------ No results for input(s): TSH, T4TOTAL, T3FREE, THYROIDAB in the last 72 hours.  Invalid input(s): FREET3 ------------------------------------------------------------------------------------------------------------------ No results for input(s): VITAMINB12, FOLATE, FERRITIN, TIBC, IRON, RETICCTPCT in the last 72 hours.  Coagulation profile No results for input(s): INR, PROTIME in the last 168 hours.  No results for input(s): DDIMER in the last 72 hours.  Cardiac Enzymes No results for input(s): CKMB, TROPONINI, MYOGLOBIN in the last 168 hours.  Invalid input(s): CK ------------------------------------------------------------------------------------------------------------------ No results found for: BNP  Inpatient Medications  Scheduled Meds: . enoxaparin (LOVENOX) injection  40 mg Subcutaneous Q24H   Continuous Infusions: . sodium chloride    . piperacillin-tazobactam (ZOSYN)  IV 3.375 g (12/01/16 1114)  . vancomycin Stopped (12/01/16 0730)   PRN Meds:.acetaminophen **OR** acetaminophen, HYDROcodone-acetaminophen, ketorolac, ondansetron **OR** ondansetron (ZOFRAN) IV, polyethylene glycol  Micro Results No results found for this or any previous visit (from the past 240 hour(s)).  Radiology Reports No results found.   [name], [name] M.D on 12/01/2016  at 12:15 PM  Between 7am to 7pm - Pager - [phone]  After 7pm go to [email or web address] - password [email or web address]  [facility] -  Office  [phone]

## 2016-12-02 ENCOUNTER — Encounter (HOSPITAL_COMMUNITY): Payer: Self-pay

## 2016-12-02 LAB — BASIC METABOLIC PANEL
Anion gap: 8 (ref 5–15)
BUN: 6 mg/dL (ref 6–20)
CALCIUM: 8.3 mg/dL — AB (ref 8.9–10.3)
CHLORIDE: 105 mmol/L (ref 101–111)
CO2: 26 mmol/L (ref 22–32)
Creatinine, Ser: 0.97 mg/dL (ref 0.61–1.24)
GFR calc Af Amer: >60 mL/min (ref >60)
GLUCOSE: 90 mg/dL (ref 65–99)
POTASSIUM: 4.3 mmol/L (ref 3.5–5.1)
Sodium: 139 mmol/L (ref 135–145)

## 2016-12-02 LAB — CBC
HEMATOCRIT: 38.3 % — AB (ref 39.0–52.0)
HEMOGLOBIN: 12.5 g/dL — AB (ref 13.0–17.0)
MCH: 29.7 pg (ref 26.0–34.0)
MCHC: 32.6 g/dL (ref 30.0–36.0)
MCV: 91 fL (ref 78.0–100.0)
Platelets: 218 10*3/uL (ref 150–400)
RBC: 4.21 MIL/uL — AB (ref 4.22–5.81)
RDW: 13.7 % (ref 11.5–15.5)
WBC: 5 10*3/uL (ref 4.0–10.5)

## 2016-12-02 LAB — HEMOGLOBIN A1C
Hgb A1c MFr Bld: 5.7 % — ABNORMAL HIGH (ref 4.8–5.6)
Mean Plasma Glucose: 117 mg/dL

## 2016-12-02 LAB — CREATININE, SERUM
Creatinine, Ser: 1.04 mg/dL (ref 0.61–1.24)
GFR calc Af Amer: >60 mL/min
GFR calc non Af Amer: >60 mL/min

## 2016-12-02 MED ORDER — SACCHAROMYCES BOULARDII 250 MG PO CAPS
250.0000 mg | ORAL_CAPSULE | Freq: Two times a day (BID) | ORAL | Status: DC
  Administered 2016-12-02 – 2016-12-04 (×5): 250 mg via ORAL
  Filled 2016-12-02 (×5): qty 1

## 2016-12-02 NOTE — Progress Notes (Signed)
Patient ID: Nathaniel Zuniga, male   DOB: 07/18/1975, 41 y.o.   MRN: [id number]   LOS: 1 day   Subjective: Doing well, pain controlled.   Objective: Vital signs in last 24 hours: Temp:  [97.2 F (36.2 C)-98.5 F (36.9 C)] 98.2 F (36.8 C) (05/25 0525) Pulse Rate:  [49-60] 58 (05/25 0525) Resp:  [11-18] 17 (05/25 0525) BP: (104-129)/(62-86) 104/62 (05/25 0525) SpO2:  [97 %-100 %] 100 % (05/25 0525)    Laboratory  CBC  Recent Labs  12/01/16 2228 12/02/16 0539  WBC 4.9 5.0  HGB 12.8* 12.5*  HCT 39.3 38.3*  PLT 210 218   BMET  Recent Labs  12/01/16 0338 12/01/16 2228 12/02/16 0539  NA 135  --  139  K 4.3  --  4.3  CL 105  --  105  CO2 24  --  26  GLUCOSE 118*  --  90  BUN 9  --  6  CREATININE 1.05 1.04 0.97  CALCIUM 8.0*  --  8.3*    Physical Exam General appearance: alert and no distress  RLE Splint intact  Sens DPN, SPN, TN intact  Motor EHL 5/5  Cap refill <2s, No significant edema   Assessment/Plan: RLE cellulitis s/p I&D, closure -- Continue vanc/Zosyn while cultures pending. Plan for splint removal Saturday by Dr. Charlann Boxerlin or Dr. Aundria Rudogers.    Freeman CaldronMichael J. Pema Thomure, PA-C Orthopedic Surgery [phone] 12/02/2016

## 2016-12-02 NOTE — Progress Notes (Addendum)
PROGRESS NOTE                                                                                                                                                                                                             Patient Demographics:    Nathaniel Zuniga, is a 41 y.o. male, DOB - 1975-12-25, AVW:[id number]  Admit date - 11/30/2016   Admitting Physician Briscoe Deutscher, MD  Outpatient Primary MD for the patient is Patient, No Pcp Per  LOS - 1  Chief Complaint  Patient presents with  . Leg Pain  . Rash       Brief Narrative  41 year old male without significant acoustical history, presents with right leg abscess, not improving on oral Bactrim, Orthopedic consulted, went for IND on 5/24 by Dr. Charlann Boxer.   Subjective:    Nathaniel Zuniga today Denies any complaints, no headache, no chest pain, no abdominal pain, reports pain is controlled and right leg.  Assessment  & Plan :    Principal Problem:   Cellulitis and abscess of leg Active Problems:   Cellulitis and abscess of right leg   Cellulitis and abscess of right lower extremity - No improvement of Bactrim an outpatient setting, and with a spontaneous drainage, reports  fever at home, status post I&D in ED. - Continue with IV vancomycin and Zosyn, pending blood cultures, and abscess obtained from I&D, so far no growth to date - Orthopedic input appreciated, status post excisional and non-excisional debridement of right leg abscess on 5/24, wound care per ortho, continue with the splint today, we likely will be removed tomorrow by Dr. Charlann Boxer.   Code Status : full  Family Communication  : None at bedisde  Disposition Plan  : home when stable  Consults  :  Ortho Dr Charlann Boxer  Procedures  : atus post excisional and non-excisional debridement of right leg abscess on 5/24,  DVT Prophylaxis  :  Lovenox -  SCDs   Lab Results  Component Value Date   PLT 218 12/02/2016    Antibiotics  :   Anti-infectives     Start     Dose/Rate Route Frequency Ordered Stop   12/01/16 2225  vancomycin (VANCOCIN) IVPB 1000 mg/200 mL premix     1,000 mg 200 mL/hr over 60 Minutes Intravenous Every 12 hours 12/01/16 2226  12/01/16 1000  piperacillin-tazobactam (ZOSYN) IVPB 3.375 g     3.375 g 12.5 mL/hr over 240 Minutes Intravenous Every 8 hours 12/01/16 0931     12/01/16 0730  vancomycin (VANCOCIN) IVPB 1000 mg/200 mL premix  Status:  Discontinued     1,000 mg 200 mL/hr over 60 Minutes Intravenous Every 12 hours 11/30/16 2111 12/01/16 2226   11/30/16 1900  vancomycin (VANCOCIN) IVPB 1000 mg/200 mL premix     1,000 mg 200 mL/hr over 60 Minutes Intravenous  Once 11/30/16 1857 11/30/16 2030        Objective:   Vitals:   12/01/16 2145 12/01/16 2215 12/02/16 0525 12/02/16 1019  BP: 119/80 127/72 104/62 120/71  Pulse: (!) 54 (!) 49 (!) 58 (!) 59  Resp: 14 16 17 17   Temp: 97.9 F (36.6 C) 97.3 F (36.3 C) 98.2 F (36.8 C) 97.4 F (36.3 C)  TempSrc:  Oral Oral Oral  SpO2: 100% 100% 100% 99%  Weight:      Height:        Wt Readings from Last 3 Encounters:  11/30/16 68 kg (150 lb)  07/01/11 69.9 kg (154 lb)  06/30/11 68.5 kg (151 lb)     Intake/Output Summary (Last 24 hours) at 12/02/16 1430 Last data filed at 12/02/16 1003  Gross per 24 hour  Intake           1994.5 ml  Output               10 ml  Net           1984.5 ml     Physical Exam  Awake alert 3 Good air entry bilaterally, no wheezing or rales rhonchi RRR, no rubs, gallops +ve B.Sounds, Abd Soft, No tenderness,  No rebound - guarding or rigidity. Lower extremity in a splint, with Ace bandage , with good capillary refills, .     Data Review:    CBC  Recent Labs Lab 11/30/16 1908 12/01/16 0338 12/01/16 2228 12/02/16 0539  WBC 5.6 3.9* 4.9 5.0  HGB 13.5 12.3* 12.8* 12.5*  HCT 40.3 37.0* 39.3 38.3*  PLT 214 177 210 218  MCV 89.2 89.2 91.4 91.0  MCH 29.9 29.6 29.8 29.7  MCHC 33.5 33.2 32.6 32.6  RDW 13.2 13.3  13.8 13.7  LYMPHSABS 0.9  --   --   --   MONOABS 0.4  --   --   --   EOSABS 0.3  --   --   --   BASOSABS 0.0  --   --   --     Chemistries   Recent Labs Lab 11/30/16 1908 12/01/16 0338 12/01/16 2228 12/02/16 0539  NA 133* 135  --  139  K 3.8 4.3  --  4.3  CL 101 105  --  105  CO2 24 24  --  26  GLUCOSE 133* 118*  --  90  BUN 7 9  --  6  CREATININE 1.01 1.05 1.04 0.97  CALCIUM 8.5* 8.0*  --  8.3*   ------------------------------------------------------------------------------------------------------------------ No results for input(s): CHOL, HDL, LDLCALC, TRIG, CHOLHDL, LDLDIRECT in the last 72 hours.  Lab Results  Component Value Date   HGBA1C 5.7 (H) 12/01/2016   ------------------------------------------------------------------------------------------------------------------ No results for input(s): TSH, T4TOTAL, T3FREE, THYROIDAB in the last 72 hours.  Invalid input(s): FREET3 ------------------------------------------------------------------------------------------------------------------ No results for input(s): VITAMINB12, FOLATE, FERRITIN, TIBC, IRON, RETICCTPCT in the last 72 hours.  Coagulation profile No results for input(s): INR, PROTIME in the last  168 hours.  No results for input(s): DDIMER in the last 72 hours.  Cardiac Enzymes No results for input(s): CKMB, TROPONINI, MYOGLOBIN in the last 168 hours.  Invalid input(s): CK ------------------------------------------------------------------------------------------------------------------ No results found for: BNP  Inpatient Medications  Scheduled Meds: . enoxaparin (LOVENOX) injection  40 mg Subcutaneous Q24H  . saccharomyces boulardii  250 mg Oral BID   Continuous Infusions: . sodium chloride 75 mL/hr at 12/01/16 1230  . sodium chloride 50 mL/hr at 12/01/16 2157  . piperacillin-tazobactam (ZOSYN)  IV Stopped (12/02/16 1422)  . vancomycin Stopped (12/02/16 1112)   PRN Meds:.acetaminophen  **OR** acetaminophen, HYDROcodone-acetaminophen, ketorolac, metoCLOPramide **OR** metoCLOPramide (REGLAN) injection, ondansetron **OR** ondansetron (ZOFRAN) IV, polyethylene glycol  Micro Results Recent Results (from the past 240 hour(s))  Culture, blood (routine x 2)     Status: None (Preliminary result)   Collection Time: 11/30/16  7:06 PM  Result Value Ref Range Status   Specimen Description BLOOD BLOOD RIGHT FOREARM  Final   Special Requests   Final    BOTTLES DRAWN AEROBIC AND ANAEROBIC Blood Culture adequate volume   Culture NO GROWTH 2 DAYS  Final   Report Status PENDING  Incomplete  Culture, blood (routine x 2)     Status: None (Preliminary result)   Collection Time: 11/30/16  9:03 PM  Result Value Ref Range Status   Specimen Description BLOOD LEFT ANTECUBITAL  Final   Special Requests   Final    BOTTLES DRAWN AEROBIC AND ANAEROBIC Blood Culture adequate volume   Culture NO GROWTH 2 DAYS  Final   Report Status PENDING  Incomplete  Surgical pcr screen     Status: None   Collection Time: 12/01/16  6:42 PM  Result Value Ref Range Status   MRSA, PCR NEGATIVE NEGATIVE Final   Staphylococcus aureus NEGATIVE NEGATIVE Final    Comment:        The Xpert SA Assay (FDA approved for NASAL specimens in patients over 100 years of age), is one component of a comprehensive surveillance program.  Test performance has been validated by [facility] for patients greater than or equal to 63 year old. It is not intended to diagnose infection nor to guide or monitor treatment.   Aerobic/Anaerobic Culture (surgical/deep wound)     Status: None (Preliminary result)   Collection Time: 12/01/16  8:37 PM  Result Value Ref Range Status   Specimen Description ABSCESS RIGHT LOWER LEG  Final   Special Requests NONE  Final   Gram Stain   Final    FEW WBC PRESENT,BOTH PMN AND MONONUCLEAR RARE GRAM POSITIVE COCCI    Culture PENDING  Incomplete   Report Status PENDING  Incomplete    Radiology  Reports Ct Tibia Fibula Right W Contrast  Result Date: 12/01/2016 CLINICAL DATA:  Infection on the anterior right lower leg since a popping a bump along the shin 2 weeks ago. Initial encounter. EXAM: CT OF THE LOWER RIGHT EXTREMITY WITH CONTRAST TECHNIQUE: Multidetector CT imaging of the lower right extremity was performed according to the standard protocol following intravenous contrast administration. COMPARISON:  None. CONTRAST:  100 ml ISOVUE-300 IOPAMIDOL (ISOVUE-300) INJECTION 61% FINDINGS: Bones/Joint/Cartilage No bony destructive change or periosteal reaction is identified. The patient has advanced for age appearing degenerative disease about the right knee. A loose body in the anterior aspect of the joint on the lateral side measures 1.4 cm in diameter. There is also some ossification of the distal aspect of the syndesmosis between the tibia and fibula consistent with  old trauma. There also appears to be a remote fracture of the anterior process of the calcaneus. Ligaments Suboptimally assessed by CT. Muscles and Tendons Intact. No gas dissecting along fascial planes is identified. No intramuscular fluid collection. Soft tissues There is some stranding in subcutaneous fatty tissues about the anterior aspect of the lower leg and skin thickening is identified. A tiny air bubble is seen within subcutaneous tissues compatible with history of incision and drainage of a subcutaneous abscess yesterday. No rim enhancing fluid collection is identified. IMPRESSION: Findings consistent with incision and drainage of a subcutaneous abscess yesterday. No residual abscess is identified. Findings consistent with cellulitis along the anterior aspect of the mid right lower leg. Negative for fasciitis, myositis or osteomyelitis. 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 Electronically Signed   By: Drusilla Kanner M.D.   On: 12/01/2016 15:10      Keno Caraway M.D on 12/02/2016 at 2:30 PM  Between 7am to 7pm - Pager - [phone]  After 7pm go to [email or web address] - password [facility]  [facility] -  Office  [phone]

## 2016-12-02 NOTE — Op Note (Signed)
NAME:  Nathaniel Zuniga, Nathaniel Zuniga NO.:  [id number]  MEDICAL RECORD NO.:  [id number]  LOCATION:                                 FACILITY:  PHYSICIAN:  Nathaniel Zuniga. Nathaniel Zuniga, M.D.       DATE OF BIRTH:  DATE OF PROCEDURE:  12/01/2016 DATE OF DISCHARGE:                              OPERATIVE REPORT   PREOPERATIVE DIAGNOSIS:  Right leg superficial abscesses.  POSTOPERATIVE DIAGNOSIS:  Right leg superficial abscesses.  PROCEDURE:  Excisional and nonexcisional debridement of right leg abscess.  This involved about a 5-inch incision, incorporating the areas of necrotic skin.  This was sharply done with a scalpel including debridement of nonviable skin, subcutaneous tissue.  Intraoperative findings included no evidence of any penetration of the muscle fascia. Cultures were taken.  Nonexcisional debridement included 1500 mL of normal saline solution pulse lavage, primary wound closure.  SURGEON:  Nathaniel Zuniga. Nathaniel Zuniga, M.D.  ASSISTANT:  Surgical team.  ANESTHESIA:  General, LMA.  SPECIMENS:  None.  COMPLICATION:  None.  DRAINS:  None.  FINDINGS:  See body of operative note.  INDICATIONS FOR PROCEDURE:  Nathaniel Zuniga is a 41 year old male who has presented over the past couple of weeks with right leg boils.  He attempted to do some debridement himself, persisted with leg swelling.  He was seen and evaluated in the emergency room, placed on Bactrim, the swelling and pain persisted.  He was eventually brought back to the emergency room on Nov 30, 2016, with persistent pain.  He was subsequently admitted to the Medical Service, placed on vancomycin and Zosyn.  Orthopedics was consulted for evaluation.  I examined his leg and found that he had two quarter-sized areas on his leg anterolateral, mid leg with surrounding erythematous changes, decreased swelling already with blistering of the skin, but these areas were necrotic.  There was no active drainage.  I felt that it was in his best  interest to go ahead and surgically debride his skin to try to get it to heal back.  It was at this point showing no evidence of healing on its own.  Risks of non-nonhealing skin, need for future surgeries were discussed, use of wound VAC is a possibility if this does not work primarily.  Consent was obtained for management of this.  PROCEDURE IN DETAIL:  The patient was brought to the operative theater. Once adequate anesthesia, preoperative antibiotics already administered including vancomycin and Zosyn, he was positioned supine with a thigh tourniquet placed.  The right lower extremity was then prepped and draped in sterile fashion.  Time-out was performed identifying the patient, planned procedure, and extremity.  At this point, further examination closely in the OR to determine these areas of skin nonviability were quite extensive.  I did not feel that I could easily debride these without extending the incision proximal and distal to this.  Subsequently, this was carried out.  I incorporated and sharply debrided the nonviable skin, the necrotic skin.  Once the skin was opened, I did take cultures in the superficial layer.  I then further debrided the subcutaneous tissue.  There was no evidence of any penetration of the muscle fascia.  Once I had freed this tissue up and debrided this adequately, sharply, I then used a 1500 mL of normal saline solution with a Pulsavac.  Once this was done, I evaluated for wound closure.  I began with 2-0 nylon suture at the end proximal and distal, and try to work forward.  I worked to the central portion.  I was able to manually get his skin to come together, but it did have tension on it.  This raises some concern to me in terms of ultimate healing; however, I am relying on his overall health and age to try to get the skin to heal. The skin was reapproximated using 2-0 nylon sutures.  I actually did remove some sutures and allow it to be little  bit gaped open to decrease some of the tension on the skin.  Given these findings, I elected to also place him in a posterior splint to rest his leg, may give him a CAM walker boot when he discharge to try to prevent excessive movement of the calf muscle to allow this to rest and heal better.  I will explain this to him in the postoperative period.  Once this was done, the wound was cleaned, dried and dressed sterilely with Xeroform and a bulky sterile wrap.  I then placed him the posterior splint.  He was woken from anesthesia and brought to the recovery room in stable condition.  He will be admitted back to the floor on vanc and Zosyn.  I will follow up his wound probably on postop day 2, this will be on Saturday and determine his discharge status with oral antibiotics.  Cultures can be evaluated, but maybe culture negative due to the fact that he is on vanc and Zosyn.  The key will be determined what antibiotic he goes on orally, which he will need until his wound heals.  We will need to watch his wound closely.  If it have evidence of necrosis of the skin edges, then he may need to get back to the operating room, have wound VAC placement.     Nathaniel FrankelMatthew D. Nathaniel Boxerlin, M.D.     MDO/MEDQ  D:  12/01/2016  T:  12/02/2016  Job:  [id number]

## 2016-12-03 LAB — GLUCOSE, CAPILLARY: GLUCOSE-CAPILLARY: 122 mg/dL — AB (ref 65–99)

## 2016-12-03 MED ORDER — ASPIRIN 325 MG PO TABS
325.0000 mg | ORAL_TABLET | Freq: Every day | ORAL | Status: DC
  Administered 2016-12-03 – 2016-12-04 (×2): 325 mg via ORAL
  Filled 2016-12-03 (×2): qty 1

## 2016-12-03 NOTE — Progress Notes (Signed)
Patient ID: Nathaniel Zuniga, male   DOB: 08/23/1975, 41 y.o.   MRN: [id number] Subjective: 2 Days Post-Op Procedure(s) (LRB): IRRIGATION AND DEBRIDEMENT LEG , POSSIBLE WOUND CLOSURE (Right)    Patient reports pain as mild.  Anxious about his right leg wound  Objective:   VITALS:   Vitals:   12/02/16 2151 12/03/16 0519  BP: (!) 146/90 107/62  Pulse: 67 (!) 52  Resp: 19 19  Temp: 98.7 F (37.1 C) 97.5 F (36.4 C)    Neurovascular intact  I took down his right leg splint and dressing.  Swelling down considerably but wound edges a little dusky appearing at a couple spots.  I removed 2 sutures to try and relieve some of the skin tension. Redressed with xeroform gauze and gauze   LABS  Recent Labs  12/01/16 0338 12/01/16 2228 12/02/16 0539  HGB 12.3* 12.8* 12.5*  HCT 37.0* 39.3 38.3*  WBC 3.9* 4.9 5.0  PLT 177 210 218     Recent Labs  11/30/16 1908 12/01/16 0338 12/01/16 2228 12/02/16 0539  NA 133* 135  --  139  K 3.8 4.3  --  4.3  BUN 7 9  --  6  CREATININE 1.01 1.05 1.04 0.97  GLUCOSE 133* 118*  --  90    No results for input(s): LABPT, INR in the last 72 hours.   Assessment/Plan: 2 Days Post-Op Procedure(s) (LRB): IRRIGATION AND DEBRIDEMENT LEG , POSSIBLE WOUND CLOSURE (Right)  Plan: Ordered a cam walker with intent as reviewed with patient to keep leg skin as calm as possible I will want to take a look at his wound Ok to transition to PO antibiotics per medical recs Should remain on antibiotics for 4 weeks No further lab work necessary Anticipate discharge Sunday or Monday as I am still concerned about his leg wound

## 2016-12-03 NOTE — Progress Notes (Signed)
Orthopedic Tech Progress Note Patient Details:  Nathaniel BallerDion Q Zuniga 03/25/1976 [id number]  Ortho Devices Type of Ortho Device: CAM walker Ortho Device/Splint Location: rle Ortho Device/Splint Interventions: Application   Mylan Schwarz 12/03/2016, 9:47 AM

## 2016-12-03 NOTE — Progress Notes (Addendum)
PROGRESS NOTE                                                                                                                                                                                                             Patient Demographics:    Nathaniel Zuniga, is a 41 y.o. male, DOB - 11/29/1975, ZOX:[id number]  Admit date - 11/30/2016   Admitting Physician Briscoe Deutscher, MD  Outpatient Primary MD for the patient is Patient, No Pcp Per  LOS - 2  Chief Complaint  Patient presents with  . Leg Pain  . Rash       Brief Narrative  41 year old male without significant acoustical history, presents with right leg abscess, not improving on oral Bactrim, Orthopedic consulted, went for IND on 5/24 by Dr. Charlann Boxer.   Subjective:    Aland Chestnutt today Denies any fever, chills, chest pain or shortness of breath, reports leg pain is controlled    Assessment  & Plan :    Principal Problem:   Cellulitis and abscess of leg Active Problems:   Cellulitis and abscess of right leg   Cellulitis and abscess of right lower extremity - No improvement of Bactrim an outpatient setting, and with a spontaneous drainage, reports  fever at home, status post I&D in ED. - Orthopedic input appreciated, status post excisional and non-excisional debridement of right leg abscess on 5/24, wound care per ortho,cam walker applied  , or throat as: Do one tomorrow, possible discharge Sunday or Monday  - Empirically on IV vancomycin and Zosyn, wound culture growing Staphylococcus aureus, so will DC Zosyn, continue with vancomycin during hospital stay, will change to by mouth antibiotics on discharge for total of 4 weeks per ortho recommendation.   Code Status : full  Family Communication  : None at bedisde  Disposition Plan  : home when stable  Consults  :  Ortho Dr Charlann Boxer  Procedures  : atus post excisional and non-excisional debridement of right leg abscess on 5/24,  DVT Prophylaxis  :   aspirin -  SCDs   Lab Results  Component Value Date   PLT 218 12/02/2016    Antibiotics  :   Anti-infectives    Start     Dose/Rate Route Frequency Ordered Stop   12/01/16 2225  vancomycin (VANCOCIN) IVPB 1000 mg/200 mL premix  1,000 mg 200 mL/hr over 60 Minutes Intravenous Every 12 hours 12/01/16 2226     12/01/16 1000  piperacillin-tazobactam (ZOSYN) IVPB 3.375 g  Status:  Discontinued     3.375 g 12.5 mL/hr over 240 Minutes Intravenous Every 8 hours 12/01/16 0931 12/03/16 1307   12/01/16 0730  vancomycin (VANCOCIN) IVPB 1000 mg/200 mL premix  Status:  Discontinued     1,000 mg 200 mL/hr over 60 Minutes Intravenous Every 12 hours 11/30/16 2111 12/01/16 2226   11/30/16 1900  vancomycin (VANCOCIN) IVPB 1000 mg/200 mL premix     1,000 mg 200 mL/hr over 60 Minutes Intravenous  Once 11/30/16 1857 11/30/16 2030        Objective:   Vitals:   12/02/16 1019 12/02/16 1535 12/02/16 2151 12/03/16 0519  BP: 120/71 123/69 (!) 146/90 107/62  Pulse: (!) 59 65 67 (!) 52  Resp: 17 18 19 19   Temp: 97.4 F (36.3 C) 98.4 F (36.9 C) 98.7 F (37.1 C) 97.5 F (36.4 C)  TempSrc: Oral Oral Oral Oral  SpO2: 99% 98% 99% 100%  Weight:      Height:        Wt Readings from Last 3 Encounters:  11/30/16 68 kg (150 lb)  07/01/11 69.9 kg (154 lb)  06/30/11 68.5 kg (151 lb)     Intake/Output Summary (Last 24 hours) at 12/03/16 1307 Last data filed at 12/03/16 0916  Gross per 24 hour  Intake             4945 ml  Output                0 ml  Net             4945 ml     Physical Exam  Awake alert 3  CTA bilaterally, no wheezing or rhonchi  RRR, no rubs, gallops +ve B.Sounds, Abd Soft, No tenderness,  No rebound - guarding or rigidity. Right lower extremity in a Cam Walker, good capillary refill     Data Review:    CBC  Recent Labs Lab 11/30/16 1908 12/01/16 0338 12/01/16 2228 12/02/16 0539  WBC 5.6 3.9* 4.9 5.0  HGB 13.5 12.3* 12.8* 12.5*  HCT 40.3 37.0* 39.3 38.3*    PLT 214 177 210 218  MCV 89.2 89.2 91.4 91.0  MCH 29.9 29.6 29.8 29.7  MCHC 33.5 33.2 32.6 32.6  RDW 13.2 13.3 13.8 13.7  LYMPHSABS 0.9  --   --   --   MONOABS 0.4  --   --   --   EOSABS 0.3  --   --   --   BASOSABS 0.0  --   --   --     Chemistries   Recent Labs Lab 11/30/16 1908 12/01/16 0338 12/01/16 2228 12/02/16 0539  NA 133* 135  --  139  K 3.8 4.3  --  4.3  CL 101 105  --  105  CO2 24 24  --  26  GLUCOSE 133* 118*  --  90  BUN 7 9  --  6  CREATININE 1.01 1.05 1.04 0.97  CALCIUM 8.5* 8.0*  --  8.3*   ------------------------------------------------------------------------------------------------------------------ No results for input(s): CHOL, HDL, LDLCALC, TRIG, CHOLHDL, LDLDIRECT in the last 72 hours.  Lab Results  Component Value Date   HGBA1C 5.7 (H) 12/01/2016   ------------------------------------------------------------------------------------------------------------------ No results for input(s): TSH, T4TOTAL, T3FREE, THYROIDAB in the last 72 hours.  Invalid input(s): FREET3 ------------------------------------------------------------------------------------------------------------------ No results for input(s): VITAMINB12, FOLATE,  FERRITIN, TIBC, IRON, RETICCTPCT in the last 72 hours.  Coagulation profile No results for input(s): INR, PROTIME in the last 168 hours.  No results for input(s): DDIMER in the last 72 hours.  Cardiac Enzymes No results for input(s): CKMB, TROPONINI, MYOGLOBIN in the last 168 hours.  Invalid input(s): CK ------------------------------------------------------------------------------------------------------------------ No results found for: BNP  Inpatient Medications  Scheduled Meds: . aspirin  325 mg Oral Daily  . saccharomyces boulardii  250 mg Oral BID   Continuous Infusions: . sodium chloride 75 mL/hr at 12/01/16 1230  . sodium chloride 50 mL/hr at 12/02/16 2105  . vancomycin 1,000 mg (12/02/16 2237)   PRN  Meds:.acetaminophen **OR** acetaminophen, HYDROcodone-acetaminophen, ketorolac, metoCLOPramide **OR** metoCLOPramide (REGLAN) injection, ondansetron **OR** ondansetron (ZOFRAN) IV, polyethylene glycol  Micro Results Recent Results (from the past 240 hour(s))  Culture, blood (routine x 2)     Status: None (Preliminary result)   Collection Time: 11/30/16  7:06 PM  Result Value Ref Range Status   Specimen Description BLOOD BLOOD RIGHT FOREARM  Final   Special Requests   Final    BOTTLES DRAWN AEROBIC AND ANAEROBIC Blood Culture adequate volume   Culture NO GROWTH 3 DAYS  Final   Report Status PENDING  Incomplete  Culture, blood (routine x 2)     Status: None (Preliminary result)   Collection Time: 11/30/16  9:03 PM  Result Value Ref Range Status   Specimen Description BLOOD LEFT ANTECUBITAL  Final   Special Requests   Final    BOTTLES DRAWN AEROBIC AND ANAEROBIC Blood Culture adequate volume   Culture NO GROWTH 3 DAYS  Final   Report Status PENDING  Incomplete  Surgical pcr screen     Status: None   Collection Time: 12/01/16  6:42 PM  Result Value Ref Range Status   MRSA, PCR NEGATIVE NEGATIVE Final   Staphylococcus aureus NEGATIVE NEGATIVE Final    Comment:        The Xpert SA Assay (FDA approved for NASAL specimens in patients over 40 years of age), is one component of a comprehensive surveillance program.  Test performance has been validated by [facility] for patients greater than or equal to 40 year old. It is not intended to diagnose infection nor to guide or monitor treatment.   Aerobic/Anaerobic Culture (surgical/deep wound)     Status: None (Preliminary result)   Collection Time: 12/01/16  8:37 PM  Result Value Ref Range Status   Specimen Description ABSCESS RIGHT LOWER LEG  Final   Special Requests NONE  Final   Gram Stain   Final    FEW WBC PRESENT,BOTH PMN AND MONONUCLEAR RARE GRAM POSITIVE COCCI    Culture RARE STAPHYLOCOCCUS AUREUS  Final   Report Status  PENDING  Incomplete    Radiology Reports Ct Tibia Fibula Right W Contrast  Result Date: 12/01/2016 CLINICAL DATA:  Infection on the anterior right lower leg since a popping a bump along the shin 2 weeks ago. Initial encounter. EXAM: CT OF THE LOWER RIGHT EXTREMITY WITH CONTRAST TECHNIQUE: Multidetector CT imaging of the lower right extremity was performed according to the standard protocol following intravenous contrast administration. COMPARISON:  None. CONTRAST:  100 ml ISOVUE-300 IOPAMIDOL (ISOVUE-300) INJECTION 61% FINDINGS: Bones/Joint/Cartilage No bony destructive change or periosteal reaction is identified. The patient has advanced for age appearing degenerative disease about the right knee. A loose body in the anterior aspect of the joint on the lateral side measures 1.4 cm in diameter. There is also some ossification of  the distal aspect of the syndesmosis between the tibia and fibula consistent with old trauma. There also appears to be a remote fracture of the anterior process of the calcaneus. Ligaments Suboptimally assessed by CT. Muscles and Tendons Intact. No gas dissecting along fascial planes is identified. No intramuscular fluid collection. Soft tissues There is some stranding in subcutaneous fatty tissues about the anterior aspect of the lower leg and skin thickening is identified. A tiny air bubble is seen within subcutaneous tissues compatible with history of incision and drainage of a subcutaneous abscess yesterday. No rim enhancing fluid collection is identified. IMPRESSION: Findings consistent with incision and drainage of a subcutaneous abscess yesterday. No residual abscess is identified. Findings consistent with cellulitis along the anterior aspect of the mid right lower leg. Negative for fasciitis, myositis or osteomyelitis. 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 Electronically Signed   By: Drusilla Kanner M.D.   On: 12/01/2016 15:10     Garret Teale M.D on 12/03/2016 at 1:07 PM  Between 7am to 7pm - Pager - [phone]  After 7pm go to [email or web address] - password [facility]  [facility] -  Office  [phone]

## 2016-12-04 MED ORDER — DOXYCYCLINE HYCLATE 100 MG PO TABS: 100.0000 mg | ORAL_TABLET | Freq: Two times a day (BID) | ORAL | 0 refills | Status: AC

## 2016-12-04 NOTE — Progress Notes (Signed)
Right leg wound dressing changed by Dr Charlann Boxerlin this morning. Pt is using the CAM walker when ambulating. Pain is controlled. Discharge instructions given to pt, instructed on how to do wound care and dressing changes, verbalized understanding. Discharged to home accompanied by brother.

## 2016-12-04 NOTE — Discharge Summary (Signed)
AL BRACEWELL, is a 41 y.o. male  DOB December 12, 1975  MRN [id number].  Admission date:  11/30/2016  Admitting Physician  Briscoe Deutscher, MD  Discharge Date:  12/04/2016   Primary MD  Patient, No Pcp Per  Recommendations for primary care physician for things to follow:  - Please check CBC, BMP during next visit, patient to  follow with orthopedic as instructed   Admission Diagnosis  Lymphangitis [I89.1] Cellulitis of right lower extremity [L03.115] Failure of outpatient treatment [Z78.9]   Discharge Diagnosis  Lymphangitis [I89.1] Cellulitis of right lower extremity [L03.115] Failure of outpatient treatment [Z78.9]    Principal Problem:   Cellulitis and abscess of leg Active Problems:   Cellulitis and abscess of right leg      Past Medical History:  Diagnosis Date  . Acute respiratory failure (HCC)   . Elevated liver enzymes   . Leukocytosis   . SIRS (systemic inflammatory response syndrome) (HCC)   . Tachycardia   . Tobacco abuse   . Volume depletion     Past Surgical History:  Procedure Laterality Date  . broken nose repair    . I&D EXTREMITY Right 12/01/2016   Procedure: IRRIGATION AND DEBRIDEMENT LEG , POSSIBLE WOUND CLOSURE;  Surgeon: Durene Romans, MD;  Location: [facility] OR;  Service: Orthopedics;  Laterality: Right;       History of present illness and  Hospital Course:     Kindly see H&P for history of present illness and admission details, please review complete Labs, Consult reports and Test reports for all details in brief  HPI  from the history and physical done on the day of admission 11/30/2016  HPI: RASHIED CORALLO is a 41 y.o. male who denies any significant past medical history, presented to the emergency department for evaluation of fevers and pain, redness, swelling, and drainage from the right leg. Patient reports that he had been in his usual state of health until  approximately 2 weeks ago when he noted a small red bump on his anterior right shin. Over the ensuing days, the erythema expanded and the patient developed pain at the site. By one week ago, he was developing fevers and was evaluated at an outside hospital where he was prescribed Bactrim. He reports continued adherence with the Bactrim, which seemed to help initially with reduced pain, swelling, and erythema. The past 3 days or so, patient reports worsening with erythema now involving most of the lower leg, severe pain, spontaneous drainage, and fevers with chills. He denies any history of skin or soft tissue infections in the past, denies history of diabetes, and denies any use of illicit substances.  ED Course: Upon arrival to the ED, patient is found to be afebrile, saturating well on room air, and with vital signs stable. Chemistry panels notable for mild hyponatremia and CBC is unremarkable. Lactic acid is reassuring at 1.31. Abscess was incised and drained in the ED and the patient was treated with a liter of normal saline, 4 mg IV morphine, and empiric  vancomycin. He remains hemodynamically stable and in no apparent respiratory distress, but will be admitted to the medical/surgical unit for ongoing evaluation and management of cellulitis with abscess, with fevers and worsening despite appropriate outpatient antibiotic  Hospital Course   41 year old male without significant acoustical history, presents with right leg abscess, not improving on oral Bactrim, Orthopedic consulted, went for I&D on 5/24 by Dr. Charlann Boxer.  Cellulitis and abscess of right lower extremity - No improvement of Bactrim an outpatient setting, and with a spontaneous drainage, reports  fever at home, status post I&D in ED. Orthopedic input appreciated, status post excisional and non-excisional debridement of right leg abscess on 5/24, wound care per ortho,cam walker applied  , Empirically on IV vancomycin and Zosyn, wound culture  growing MRSA, so will DCed Zosyn, continue with vancomycin during hospital stay, discussed with ID, patient will need to be discharge in either doxycycline or Bactrim DS , so we'll discharge on doxycycline given questionable allergy to Bactrim DS , patient to continue with wound care at home, he was given some supplies, and to follow with dorsal as an outpatient .   Discharge Condition:  Stable   Follow UP  Follow-up Information    [facility] Follow up on 12/20/2016.   Why:  9 am for follow up apt Contact information: [address] [place] [address] [phone]       Durene Romans, MD Follow up.   Specialty:  Orthopedic Surgery Why:  to follow wound closely Contact information: [address] [place] [address] [phone]             Discharge Instructions  and  Discharge Medications    Discharge Instructions    Discharge instructions    Complete by:  As directed    Follow with Primary MD in 7 days   Get CBC, CMP, checked  by Primary MD next visit.    Activity: As tolerated    Disposition Home    Diet: Regular diet  On your next visit with your primary care physician please Get Medicines reviewed and adjusted.   Please request your Prim.MD to go over all Hospital Tests and Procedure/Radiological results at the follow up, please get all Hospital records sent to your Prim MD by signing hospital release before you go home.   If you experience worsening of your admission symptoms, develop shortness of breath, life threatening emergency, suicidal or homicidal thoughts you must seek medical attention immediately by calling 911 or calling your MD immediately  if symptoms less severe.  You Must read complete instructions/literature along with all the possible adverse reactions/side effects for all the Medicines you take and that have been prescribed to you. Take any new Medicines after you  have completely understood and accpet all the possible adverse reactions/side effects.   Do not drive, operating heavy machinery, perform activities at heights, swimming or participation in water activities or provide baby sitting services if your were admitted for syncope or siezures until you have seen by Primary MD or a Neurologist and advised to do so again.  Do not drive when taking Pain medications.    Do not take more than prescribed Pain, Sleep and Anxiety Medications  Special Instructions: If you have smoked or chewed Tobacco  in the last 2 yrs please stop smoking, stop any regular Alcohol  and or any Recreational drug use.  Wear Seat belts while driving.   Please note  You were cared  for by a hospitalist during your hospital stay. If you have any questions about your discharge medications or the care you received while you were in the hospital after you are discharged, you can call the unit and asked to speak with the hospitalist on call if the hospitalist that took care of you is not available. Once you are discharged, your primary care physician will handle any further medical issues. Please note that NO REFILLS for any discharge medications will be authorized once you are discharged, as it is imperative that you return to your primary care physician (or establish a relationship with a primary care physician if you do not have one) for your aftercare needs so that they can reassess your need for medications and monitor your lab values.   Increase activity slowly    Complete by:  As directed      Allergies as of 12/04/2016      Reactions   Bactrim [sulfamethoxazole-trimethoprim] Itching, Dermatitis, Rash      Medication List    STOP taking these medications   LASIX 20 MG tablet Generic drug:  furosemide   sulfamethoxazole-trimethoprim 800-160 MG tablet Commonly known as:  BACTRIM DS,SEPTRA DS     TAKE these medications   doxycycline 100 MG tablet Commonly known as:   VIBRA-TABS Take 1 tablet (100 mg total) by mouth 2 (two) times daily.   ibuprofen 200 MG tablet Commonly known as:  ADVIL,MOTRIN Take 200-800 mg by mouth every 6 (six) hours as needed for moderate pain.   loratadine 10 MG tablet Commonly known as:  CLARITIN Take 10 mg by mouth daily.         Diet and Activity recommendation: See Discharge Instructions above   Consults obtained -  Ortho   Major procedures and Radiology Reports - PLEASE review detailed and final reports for all details, in brief -    status post excisional and non-excisional debridement of right leg abscess on 5/24   Ct Tibia Fibula Right W Contrast  Result Date: 12/01/2016 CLINICAL DATA:  Infection on the anterior right lower leg since a popping a bump along the shin 2 weeks ago. Initial encounter. EXAM: CT OF THE LOWER RIGHT EXTREMITY WITH CONTRAST TECHNIQUE: Multidetector CT imaging of the lower right extremity was performed according to the standard protocol following intravenous contrast administration. COMPARISON:  None. CONTRAST:  100 ml ISOVUE-300 IOPAMIDOL (ISOVUE-300) INJECTION 61% FINDINGS: Bones/Joint/Cartilage No bony destructive change or periosteal reaction is identified. The patient has advanced for age appearing degenerative disease about the right knee. A loose body in the anterior aspect of the joint on the lateral side measures 1.4 cm in diameter. There is also some ossification of the distal aspect of the syndesmosis between the tibia and fibula consistent with old trauma. There also appears to be a remote fracture of the anterior process of the calcaneus. Ligaments Suboptimally assessed by CT. Muscles and Tendons Intact. No gas dissecting along fascial planes is identified. No intramuscular fluid collection. Soft tissues There is some stranding in subcutaneous fatty tissues about the anterior aspect of the lower leg and skin thickening is identified. A tiny air bubble is seen within subcutaneous  tissues compatible with history of incision and drainage of a subcutaneous abscess yesterday. No rim enhancing fluid collection is identified. IMPRESSION: Findings consistent with incision and drainage of a subcutaneous abscess yesterday. No residual abscess is identified. Findings consistent with cellulitis along the anterior aspect of the mid right lower leg. Negative for fasciitis, myositis or osteomyelitis.  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 Electronically Signed   By: Drusilla Kannerhomas  Dalessio M.D.   On: 12/01/2016 15:10    Micro Results    Recent Results (from the past 240 hour(s))  Culture, blood (routine x 2)     Status: None (Preliminary result)   Collection Time: 11/30/16  7:06 PM  Result Value Ref Range Status   Specimen Description BLOOD BLOOD RIGHT FOREARM  Final   Special Requests   Final    BOTTLES DRAWN AEROBIC AND ANAEROBIC Blood Culture adequate volume   Culture NO GROWTH 4 DAYS  Final   Report Status PENDING  Incomplete  Culture, blood (routine x 2)     Status: None (Preliminary result)   Collection Time: 11/30/16  9:03 PM  Result Value Ref Range Status   Specimen Description BLOOD LEFT ANTECUBITAL  Final   Special Requests   Final    BOTTLES DRAWN AEROBIC AND ANAEROBIC Blood Culture adequate volume   Culture NO GROWTH 4 DAYS  Final   Report Status PENDING  Incomplete  Surgical pcr screen     Status: None   Collection Time: 12/01/16  6:42 PM  Result Value Ref Range Status   MRSA, PCR NEGATIVE NEGATIVE Final   Staphylococcus aureus NEGATIVE NEGATIVE Final    Comment:        The Xpert SA Assay (FDA approved for NASAL specimens in patients over 821 years of age), is one component of a comprehensive surveillance program.  Test performance has been validated by [facility] for patients greater than or equal to 40 year old. It is not intended to diagnose infection nor to guide or  monitor treatment.   Aerobic/Anaerobic Culture (surgical/deep wound)     Status: None (Preliminary result)   Collection Time: 12/01/16  8:37 PM  Result Value Ref Range Status   Specimen Description ABSCESS RIGHT LOWER LEG  Final   Special Requests NONE  Final   Gram Stain   Final    FEW WBC PRESENT,BOTH PMN AND MONONUCLEAR RARE GRAM POSITIVE COCCI    Culture   Final    RARE METHICILLIN RESISTANT STAPHYLOCOCCUS AUREUS NO ANAEROBES ISOLATED; CULTURE IN PROGRESS FOR 5 DAYS    Report Status PENDING  Incomplete   Organism ID, Bacteria METHICILLIN RESISTANT STAPHYLOCOCCUS AUREUS  Final      Susceptibility   Methicillin resistant staphylococcus aureus - MIC*    CIPROFLOXACIN >=8 RESISTANT Resistant     ERYTHROMYCIN >=8 RESISTANT Resistant     GENTAMICIN <=0.5 SENSITIVE Sensitive     OXACILLIN >=4 RESISTANT Resistant     TETRACYCLINE <=1 SENSITIVE Sensitive     VANCOMYCIN 1 SENSITIVE Sensitive     TRIMETH/SULFA <=10 SENSITIVE Sensitive     CLINDAMYCIN <=0.25 SENSITIVE Sensitive     RIFAMPIN <=0.5 SENSITIVE Sensitive     Inducible Clindamycin NEGATIVE Sensitive     * RARE METHICILLIN RESISTANT STAPHYLOCOCCUS AUREUS       Today   Subjective:   Dawna PartDion Mehra today has no headache,Leg pain is controlled,, feels much better wants to go home today.   Objective:   Blood pressure (!) 135/92, pulse 61, temperature 98.1 F (36.7 C), temperature source Oral, resp. rate 19, height 5\' 6"  (1.676 m), weight 68 kg (150 lb), SpO2 100 %.   Intake/Output Summary (Last 24 hours) at 12/04/16 1215 Last data filed at 12/04/16 0919  Gross per 24 hour  Intake  2685 ml  Output                0 ml  Net             2685 ml    Exam Awake Alert, Oriented x 3, No new F.N deficits, Normal affect Symmetrical Chest wall movement, Good air movement bilaterally RRR,No Gallops,Rubs or new Murmurs,  +ve B.Sounds, Abd Soft, Non tender, , No rebound -guarding or rigidity. No Cyanosis, Clubbing or  edema, Right leg wound healing nicely, multiple stitches, no dehiscence, wound C/D/I  Data Review   CBC w Diff: Lab Results  Component Value Date   WBC 5.0 12/02/2016   HGB 12.5 (L) 12/02/2016   HCT 38.3 (L) 12/02/2016   PLT 218 12/02/2016   LYMPHOPCT 15 11/30/2016   MONOPCT 7 11/30/2016   EOSPCT 5 11/30/2016   BASOPCT 1 11/30/2016    CMP: Lab Results  Component Value Date   NA 139 12/02/2016   K 4.3 12/02/2016   CL 105 12/02/2016   CO2 26 12/02/2016   BUN 6 12/02/2016   CREATININE 0.97 12/02/2016   PROT 7.9 06/30/2011   ALBUMIN 3.8 06/30/2011   BILITOT 0.2 (L) 06/30/2011   ALKPHOS 102 06/30/2011   AST 80 (H) 06/30/2011   ALT 205 (H) 06/30/2011  .   Total Time in preparing paper work, data evaluation and todays exam - 35 minutes  Robertine Kipper M.D on 12/04/2016 at 12:15 PM  [facility]   Office  [phone]

## 2016-12-04 NOTE — Progress Notes (Signed)
Patient ID: Nathaniel Zuniga, male   DOB: 10/10/1975, 41 y.o.   MRN: [id number] Subjective: 3 Days Post-Op Procedure(s) (LRB): IRRIGATION AND DEBRIDEMENT LEG , POSSIBLE WOUND CLOSURE (Right)    Patient reports pain as mild.  Objective:   VITALS:   Vitals:   12/04/16 0100 12/04/16 0355  BP: (!) 135/93 (!) (P) 135/92  Pulse:  (P) 61  Resp:  (P) 19  Temp: 98.2 F (36.8 C) (P) 98.1 F (36.7 C)    Neurovascular intact  Right leg swelling improved Just a couple areas near important sutures with a dusky appearance but refill otherwise good Slight probable reactive erythema around wound at this point Slight bloody drainage from wound on dressing change  LABS  Recent Labs  12/01/16 2228 12/02/16 0539  HGB 12.8* 12.5*  HCT 39.3 38.3*  WBC 4.9 5.0  PLT 210 218     Recent Labs  12/01/16 2228 12/02/16 0539  NA  --  139  K  --  4.3  BUN  --  6  CREATININE 1.04 0.97  GLUCOSE  --  90    No results for input(s): LABPT, INR in the last 72 hours.   Assessment/Plan: 3 Days Post-Op Procedure(s) (LRB): IRRIGATION AND DEBRIDEMENT LEG , POSSIBLE WOUND CLOSURE (Right)   Continue ABX therapy due to Culture taken at time of surgery, speciation pending but STAPH present in wound I am fine with him going home today after dose of Vanc Would discharge on Augmentin and Bactrim DS BID (reaction to bactrim unknown and delayed and I told him to look for similar reaction)  If cultures turn earlier today and not MRSA then bactrim can be held Follow up with me on Thursday - reviewed with him plan Daily xeroform dressing changes at home

## 2016-12-04 NOTE — Discharge Instructions (Signed)
Follow with Primary MD in 7 days   Get CBC, CMP, checked  by Primary MD next visit.    Activity: As tolerated    Disposition Home    Diet: Regular diet  On your next visit with your primary care physician please Get Medicines reviewed and adjusted.   Please request your Prim.MD to go over all Hospital Tests and Procedure/Radiological results at the follow up, please get all Hospital records sent to your Prim MD by signing hospital release before you go home.   If you experience worsening of your admission symptoms, develop shortness of breath, life threatening emergency, suicidal or homicidal thoughts you must seek medical attention immediately by calling 911 or calling your MD immediately  if symptoms less severe.  You Must read complete instructions/literature along with all the possible adverse reactions/side effects for all the Medicines you take and that have been prescribed to you. Take any new Medicines after you have completely understood and accpet all the possible adverse reactions/side effects.   Do not drive, operating heavy machinery, perform activities at heights, swimming or participation in water activities or provide baby sitting services if your were admitted for syncope or siezures until you have seen by Primary MD or a Neurologist and advised to do so again.  Do not drive when taking Pain medications.    Do not take more than prescribed Pain, Sleep and Anxiety Medications  Special Instructions: If you have smoked or chewed Tobacco  in the last 2 yrs please stop smoking, stop any regular Alcohol  and or any Recreational drug use.  Wear Seat belts while driving. Change dressing to left leg wound once a day. Use Xeroform dressing.  Please note  You were cared for by a hospitalist during your hospital stay. If you have any questions about your discharge medications or the care you received while you were in the hospital after you are discharged, you can call the  unit and asked to speak with the hospitalist on call if the hospitalist that took care of you is not available. Once you are discharged, your primary care physician will handle any further medical issues. Please note that NO REFILLS for any discharge medications will be authorized once you are discharged, as it is imperative that you return to your primary care physician (or establish a relationship with a primary care physician if you do not have one) for your aftercare needs so that they can reassess your need for medications and monitor your lab values.

## 2016-12-05 LAB — CULTURE, BLOOD (ROUTINE X 2)
CULTURE: NO GROWTH
CULTURE: NO GROWTH
Special Requests: ADEQUATE
Special Requests: ADEQUATE

## 2016-12-07 LAB — AEROBIC/ANAEROBIC CULTURE W GRAM STAIN (SURGICAL/DEEP WOUND)

## 2016-12-07 LAB — AEROBIC/ANAEROBIC CULTURE (SURGICAL/DEEP WOUND)

## 2016-12-12 NOTE — Addendum Note (Signed)
Addendum  created 12/12/16 1602 by Makynzee Tigges, MD   Sign clinical note    

## 2016-12-20 ENCOUNTER — Inpatient Hospital Stay: Payer: Self-pay | Admitting: Internal Medicine

## 2016-12-22 ENCOUNTER — Inpatient Hospital Stay (INDEPENDENT_AMBULATORY_CARE_PROVIDER_SITE_OTHER): Payer: Self-pay | Admitting: Physician Assistant

## 2017-05-15 ENCOUNTER — Other Ambulatory Visit: Payer: Self-pay | Admitting: Otolaryngology

## 2017-05-15 DIAGNOSIS — Q180 Sinus, fistula and cyst of branchial cleft: Secondary | ICD-10-CM

## 2017-05-17 ENCOUNTER — Ambulatory Visit
Admission: RE | Admit: 2017-05-17 | Discharge: 2017-05-17 | Disposition: A | Discharge status: Home / Self Care | Payer: BLUE CROSS/BLUE SHIELD | Source: Ambulatory Visit | Attending: Otolaryngology | Admitting: Otolaryngology

## 2017-05-17 DIAGNOSIS — Q180 Sinus, fistula and cyst of branchial cleft: Secondary | ICD-10-CM

## 2017-05-17 MED ORDER — IOPAMIDOL (ISOVUE-300) INJECTION 61%
75.0000 mL | Freq: Once | INTRAVENOUS | Status: AC | PRN
  Administered 2017-05-17: 75 mL via INTRAVENOUS

## 2017-08-14 ENCOUNTER — Ambulatory Visit: Payer: BLUE CROSS/BLUE SHIELD | Admitting: Family Medicine

## 2017-08-14 ENCOUNTER — Ambulatory Visit: Payer: Self-pay | Admitting: Physician Assistant

## 2018-06-15 ENCOUNTER — Encounter (HOSPITAL_COMMUNITY): Payer: Self-pay | Admitting: Emergency Medicine

## 2018-06-15 ENCOUNTER — Emergency Department (HOSPITAL_COMMUNITY)
Admission: EM | Admit: 2018-06-15 | Discharge: 2018-06-15 | Disposition: A | Discharge status: Home / Self Care | Payer: 59 | Attending: Emergency Medicine | Admitting: Emergency Medicine

## 2018-06-15 DIAGNOSIS — Z87891 Personal history of nicotine dependence: Secondary | ICD-10-CM | POA: Insufficient documentation

## 2018-06-15 DIAGNOSIS — R2241 Localized swelling, mass and lump, right lower limb: Secondary | ICD-10-CM | POA: Diagnosis present

## 2018-06-15 DIAGNOSIS — L03115 Cellulitis of right lower limb: Secondary | ICD-10-CM

## 2018-06-15 NOTE — ED Provider Notes (Signed)
[facility] EMERGENCY DEPARTMENT Provider Note   CSN: [id number] Arrival date & time: 06/15/18  1942     History   Chief Complaint Chief Complaint  Patient presents with  . Wound Infection    HPI Nathaniel Zuniga is a 42 y.o. male.  42 year old male presents for wound check to the right knee.  Patient states that he was on carpet playing with his dog and had a minor carpet burn to his knee which became red and was concerning for infection.  Patient went to urgent care and was given doxycycline.  Patient has been taking the doxycycline as well as cleaning the wound with peroxide and applying Neosporin, concerned area is becoming worse.  Patient states that he was recently admitted to the hospital for a lower leg wound that became infected and he does not want to let this wound get worse.  Patient states he has a tender lymph node in his groin, denies streaking, reports he has occasional yellow drainage on the gauze pad when he changes his bandage, no limited range of motion in the knee, no fevers.  Patient has a history of eczema and sensitive skin.  No other complaints or concerns.     Past Medical History:  Diagnosis Date  . Acute respiratory failure (HCC)   . Elevated liver enzymes   . Leukocytosis   . SIRS (systemic inflammatory response syndrome) (HCC)   . Tachycardia   . Tobacco abuse   . Volume depletion     Patient Active Problem List   Diagnosis Date Noted  . Cellulitis and abscess of right leg 12/01/2016  . Cellulitis and abscess of leg 11/30/2016  . Elevated liver enzymes 06/17/2011  . Tobacco abuse 06/10/2011    Past Surgical History:  Procedure Laterality Date  . broken nose repair    . I&D EXTREMITY Right 12/01/2016   Procedure: IRRIGATION AND DEBRIDEMENT LEG , POSSIBLE WOUND CLOSURE;  Surgeon: Durene Romans, MD;  Location: [facility] OR;  Service: Orthopedics;  Laterality: Right;        Home Medications    Prior to Admission medications     Medication Sig Start Date End Date Taking? Authorizing Provider  ibuprofen (ADVIL,MOTRIN) 200 MG tablet Take 200-800 mg by mouth every 6 (six) hours as needed for moderate pain.    [provider]  loratadine (CLARITIN) 10 MG tablet Take 10 mg by mouth daily.    [provider]    Family History Family History  Problem Relation Age of Onset  . Coronary artery disease Other     Social History Social History   Tobacco Use  . Smoking status: Former Smoker    Packs/day: 1.00    Years: 18.00    Pack years: 18.00    Types: Cigarettes    Last attempt to quit: 06/09/2011    Years since quitting: 7.0  . Smokeless tobacco: Never Used  Substance Use Topics  . Alcohol use: Yes  . Drug use: Yes    Comment: occ marijuana     Allergies   Bactrim [sulfamethoxazole-trimethoprim]   Review of Systems Review of Systems  Constitutional: Negative for chills and fever.  Musculoskeletal: Negative for arthralgias, joint swelling and myalgias.  Skin: Positive for color change, rash and wound.  Allergic/Immunologic: Negative for immunocompromised state.  Neurological: Negative for weakness.  Hematological: Positive for adenopathy.  Psychiatric/Behavioral: Negative for confusion.  All other systems reviewed and are negative.    Physical Exam Updated Vital Signs BP Marland Kitchen)  151/101 (BP Location: Right Arm)   Pulse 88   Temp 98.3 F (36.8 C) (Oral)   Resp 16   SpO2 96%   Physical Exam  Constitutional: He is oriented to person, place, and time. He appears well-developed and well-nourished. No distress.  HENT:  Head: Normocephalic and atraumatic.  Cardiovascular: Intact distal pulses.  Pulmonary/Chest: Effort normal.  Musculoskeletal: He exhibits no edema, tenderness or deformity.  Neurological: He is alert and oriented to person, place, and time. No sensory deficit.  Skin: Skin is warm and dry. Rash noted. He is not diaphoretic. There is erythema.     Psychiatric:  He has a normal mood and affect. His behavior is normal.  Nursing note and vitals reviewed.    ED Treatments / Results  Labs (all labs ordered are listed, but only abnormal results are displayed) Labs Reviewed - No data to display  EKG None  Radiology No results found.  Procedures Procedures (including critical care time)  Medications Ordered in ED Medications - No data to display   Initial Impression / Assessment and Plan / ED Course  I have reviewed the triage vital signs and the nursing notes.  Pertinent labs & imaging results that were available during my care of the patient were reviewed by me and considered in my medical decision making (see chart for details).  Clinical Course as of Jun 15 2056  Fri Jun 15, 2018  [id number] 42 year old male with right knee wound on doxycycline.  On exam patient appears to have cellulitis to the medial right knee, he has full range of motion of the knee without limitation or pain, there is no lymphangitis.  Patient has a history of eczema with known skin sensitivity, recommend that patient discontinue use of Neosporin, suspect he may have a neomycin allergy.  Also advised to stop cleaning the wound with peroxide and to use a mild soap such as Dove.  Patient is allergic to Bactrim, discussed possible resistance reduced resistance to MRSA with clindamycin.  At this time recommend he continue with the doxycycline, discontinue peroxide, discontinue neomycin, apply bacitracin only, clean with mild soap only.  Recheck with PCP on Monday, return to ER at anytime for any worsening or concerning symptoms and may need admission for failed outpatient therapy.  Picture of wound placed in chart.   [LM]    Clinical Course User Index [LM] Jeannie FendMurphy, Laura A, PA-C   Final Clinical Impressions(s) / ED Diagnoses   Final diagnoses:  Cellulitis of right lower extremity    ED Discharge Orders    None       Alden HippMurphy, Laura A, PA-C 06/15/18 2057    Raeford RazorKohut,  Stephen, MD 06/16/18 [phone]

## 2018-06-15 NOTE — ED Triage Notes (Signed)
Pt presents to ED for assessment of right knee wound that he is currently on Doxycycline for and states he does not feel it is improving and perhaps is getting worse.

## 2018-06-15 NOTE — Discharge Instructions (Addendum)
Continue with doxycycline as previously prescribed. Stop using Neosporin, apply plain bacitracin to area. Clean with Dove soap. Stop using peroxide. Use non-adhesive bandages. Return to ER at anytime for new or worsening symptoms, wound check with your doctor on Monday, referral given if needed.

## 2018-06-21 ENCOUNTER — Emergency Department (HOSPITAL_COMMUNITY)
Admission: EM | Admit: 2018-06-21 | Discharge: 2018-06-21 | Disposition: A | Discharge status: Home / Self Care | Payer: 59 | Attending: Emergency Medicine | Admitting: Emergency Medicine

## 2018-06-21 ENCOUNTER — Other Ambulatory Visit: Payer: Self-pay

## 2018-06-21 ENCOUNTER — Encounter (HOSPITAL_COMMUNITY): Payer: Self-pay | Admitting: *Deleted

## 2018-06-21 DIAGNOSIS — L03115 Cellulitis of right lower limb: Secondary | ICD-10-CM | POA: Diagnosis not present

## 2018-06-21 DIAGNOSIS — Z87891 Personal history of nicotine dependence: Secondary | ICD-10-CM | POA: Diagnosis not present

## 2018-06-21 DIAGNOSIS — L539 Erythematous condition, unspecified: Secondary | ICD-10-CM | POA: Diagnosis present

## 2018-06-21 LAB — BASIC METABOLIC PANEL
Anion gap: 11 (ref 5–15)
BUN: 12 mg/dL (ref 6–20)
CHLORIDE: 105 mmol/L (ref 98–111)
CO2: 24 mmol/L (ref 22–32)
Calcium: 9 mg/dL (ref 8.9–10.3)
Creatinine, Ser: 1.12 mg/dL (ref 0.61–1.24)
GFR calc non Af Amer: >60 mL/min (ref >60)
Glucose, Bld: 104 mg/dL — ABNORMAL HIGH (ref 70–99)
POTASSIUM: 4 mmol/L (ref 3.5–5.1)
Sodium: 140 mmol/L (ref 135–145)

## 2018-06-21 LAB — CBC WITH DIFFERENTIAL/PLATELET
ABS IMMATURE GRANULOCYTES: 0.06 10*3/uL (ref 0.00–0.07)
BASOS PCT: 1 %
Basophils Absolute: 0.1 10*3/uL (ref 0.0–0.1)
EOS PCT: 5 %
Eosinophils Absolute: 0.4 10*3/uL (ref 0.0–0.5)
HCT: 47.3 % (ref 39.0–52.0)
HEMOGLOBIN: 15.4 g/dL (ref 13.0–17.0)
IMMATURE GRANULOCYTES: 1 %
Lymphocytes Relative: 28 %
Lymphs Abs: 2.2 10*3/uL (ref 0.7–4.0)
MCH: 29.7 pg (ref 26.0–34.0)
MCHC: 32.6 g/dL (ref 30.0–36.0)
MCV: 91.1 fL (ref 80.0–100.0)
MONO ABS: 0.6 10*3/uL (ref 0.1–1.0)
Monocytes Relative: 8 %
NEUTROS ABS: 4.5 10*3/uL (ref 1.7–7.7)
NRBC: 0 % (ref 0.0–0.2)
Neutrophils Relative %: 57 %
Platelets: 186 10*3/uL (ref 150–400)
RBC: 5.19 MIL/uL (ref 4.22–5.81)
RDW: 13.2 % (ref 11.5–15.5)
WBC: 7.9 10*3/uL (ref 4.0–10.5)

## 2018-06-21 MED ORDER — CLINDAMYCIN HCL 150 MG PO CAPS: 150.0000 mg | ORAL_CAPSULE | Freq: Four times a day (QID) | ORAL | 0 refills | Status: DC

## 2018-06-21 NOTE — ED Provider Notes (Signed)
[facility]ENC[facility] Provider Note   CSN: [id number] Arrival date & time: 06/21/18  1523     History   Chief Complaint Chief Complaint  Patient presents with  . Knee Injury    HPI Nathaniel Zuniga is a 42 y.o. male.  He is presenting to the emergency department complaining of a wound on his right knee that is been going on for about a week.  Was initially a little bit of an abrasion from the carpet which is become more swollen and draining some clear fluid.  Urgent care put him on doxy and he was here in the ED on Friday concerned that it had not improved.  He wants to know why nobody is taken a sample of the fluid or done any testing on it.  He denies any fevers chills nausea or vomiting.  The history is provided by the patient.  Wound Check  This is a new problem. The current episode started more than 1 week ago. The problem occurs constantly. The problem has been gradually worsening. Pertinent negatives include no chest pain, no abdominal pain, no headaches and no shortness of breath. Nothing aggravates the symptoms. Nothing relieves the symptoms. The treatment provided no relief.    Past Medical History:  Diagnosis Date  . Acute respiratory failure (HCC)   . Elevated liver enzymes   . Leukocytosis   . SIRS (systemic inflammatory response syndrome) (HCC)   . Tachycardia   . Tobacco abuse   . Volume depletion     Patient Active Problem List   Diagnosis Date Noted  . Cellulitis and abscess of right leg 12/01/2016  . Cellulitis and abscess of leg 11/30/2016  . Elevated liver enzymes 06/17/2011  . Tobacco abuse 06/10/2011    Past Surgical History:  Procedure Laterality Date  . broken nose repair    . I&D EXTREMITY Right 12/01/2016   Procedure: IRRIGATION AND DEBRIDEMENT LEG , POSSIBLE WOUND CLOSURE;  Surgeon: Durene Romanslin, Matthew, MD;  [facility];  Service: Orthopedics;  Laterality: Right;        Home Medications    Prior to Admission  medications   Medication Sig Start Date End Date Taking? Authorizing Provider  ibuprofen (ADVIL,MOTRIN) 200 MG tablet Take 200-800 mg by mouth every 6 (six) hours as needed for moderate pain.    [provider]  loratadine (CLARITIN) 10 MG tablet Take 10 mg by mouth daily.    [provider]    Family History Family History  Problem Relation Age of Onset  . Coronary artery disease Other     Social History Social History   Tobacco Use  . Smoking status: Former Smoker    Packs/day: 1.00    Years: 18.00    Pack years: 18.00    Types: Cigarettes    Last attempt to quit: 06/09/2011    Years since quitting: 7.0  . Smokeless tobacco: Never Used  Substance Use Topics  . Alcohol use: Yes  . Drug use: Yes    Comment: occ marijuana     Allergies   Bactrim [sulfamethoxazole-trimethoprim]   Review of Systems Review of Systems  Constitutional: Negative for fever.  HENT: Negative for sore throat.   Eyes: Negative for visual disturbance.  Respiratory: Negative for shortness of breath.   Cardiovascular: Negative for chest pain.  Gastrointestinal: Negative for abdominal pain.  Genitourinary: Negative for dysuria.  Musculoskeletal: Negative for gait problem.  Skin: Positive for wound. Negative for rash.  Neurological: Negative for  headaches.     Physical Exam Updated Vital Signs BP 127/78 (BP Location: Right Arm)   Pulse 98   Temp 98 F (36.7 C) (Oral)   Resp 18   Ht 5\' 6"  (1.676 m)   SpO2 100%   BMI 24.21 kg/m   Physical Exam Vitals signs and nursing note reviewed.  Constitutional:      Appearance: He is well-developed.  HENT:     Head: Normocephalic and atraumatic.  Eyes:     Conjunctiva/sclera: Conjunctivae normal.  Neck:     Musculoskeletal: Neck supple.  Cardiovascular:     Rate and Rhythm: Normal rate and regular rhythm.     Heart sounds: No murmur.  Pulmonary:     Effort: Pulmonary effort is normal. No respiratory distress.     Breath  sounds: Normal breath sounds.  Abdominal:     Palpations: Abdomen is soft.     Tenderness: There is no abdominal tenderness.  Musculoskeletal: Normal range of motion.     Comments: He is a large reddened area on the medial side of the knee approximately 10 cm with some clear weepy discharge.  See enclosed photo.  Skin:    General: Skin is warm and dry.  Neurological:     Mental Status: He is alert.        ED Treatments / Results  Labs (all labs ordered are listed, but only abnormal results are displayed) Labs Reviewed  BASIC METABOLIC PANEL - Abnormal; Notable for the following components:      Result Value   Glucose, Bld 104 (*)    All other components within normal limits  AEROBIC CULTURE (SUPERFICIAL SPECIMEN)  CBC WITH DIFFERENTIAL/PLATELET    EKG None  Radiology No results found.  Procedures Procedures (including critical care time)  Medications Ordered in ED Medications - No data to display   Initial Impression / Assessment and Plan / ED Course  I have reviewed the triage vital signs and the nursing notes.  Pertinent labs & imaging results that were available during my care of the patient were reviewed by me and considered in my medical decision making (see chart for details).  Clinical Course as of Jun 22 1048  Thu Jun 21, 2018  1550 On review of patient's prior cultures he is MRSA positive.  On the last sensitivity it was tetracycline sensitive and so the Doxy should be okay.  Some of the wound actually looks more allergic contact dermatitis and he had been using Neosporin but is switched to bacitracin at the last visit.  Will check other wound culture and some screening labs and likely switch him to Clinda.   [MB]    Clinical Course User Index [MB] Terrilee Files, MD    Final Clinical Impressions(s) / ED Diagnoses   Final diagnoses:  Cellulitis of right knee    ED Discharge Orders         Ordered    clindamycin (CLEOCIN) 150 MG capsule   Every 6 hours     06/21/18 1646           Terrilee Files, MD 06/22/18 1050

## 2018-06-21 NOTE — Discharge Instructions (Signed)
You were seen in the emergency department for a wound on your right knee that has not improved.  There is probably an element of infection and possibly even some allergic reaction.  I would continue the bacitracin cream and otherwise just use some gentle soap and water.  We are switching her antibiotics to clindamycin.  A wound culture was taken and will result in a few days.  Will be important for you to follow-up and please return to emergency department if it is getting worse.

## 2018-06-21 NOTE — ED Triage Notes (Signed)
PT reports a small abrasion on RT knee on carpet . [facility] Doxy. with no improvement . Pt reports there is drainage from wound.

## 2018-06-22 ENCOUNTER — Encounter (HOSPITAL_COMMUNITY): Payer: Self-pay

## 2018-06-22 ENCOUNTER — Other Ambulatory Visit: Payer: Self-pay

## 2018-06-22 ENCOUNTER — Emergency Department (HOSPITAL_COMMUNITY)
Admission: EM | Admit: 2018-06-22 | Discharge: 2018-06-23 | Disposition: A | Discharge status: Home / Self Care | Payer: 59 | Attending: Emergency Medicine | Admitting: Emergency Medicine

## 2018-06-22 DIAGNOSIS — R21 Rash and other nonspecific skin eruption: Secondary | ICD-10-CM | POA: Diagnosis present

## 2018-06-22 DIAGNOSIS — Z87891 Personal history of nicotine dependence: Secondary | ICD-10-CM | POA: Diagnosis not present

## 2018-06-22 DIAGNOSIS — L00 Staphylococcal scalded skin syndrome: Secondary | ICD-10-CM | POA: Diagnosis not present

## 2018-06-22 DIAGNOSIS — L490 Exfoliation due to erythematous condition involving less than 10 percent of body surface: Secondary | ICD-10-CM | POA: Diagnosis not present

## 2018-06-22 DIAGNOSIS — L0100 Impetigo, unspecified: Secondary | ICD-10-CM | POA: Diagnosis not present

## 2018-06-22 NOTE — ED Triage Notes (Signed)
Pt reports increased swelling, drainage, and pain at his R knee. He scraped it 2 weeks ago and it has worsened. He has been on Doxy (which he was taking iron with by accident) and started on cleomycin last night. Pt also reports bumps on his palms that he wants looked at. Drainage is yellow. He also reports intermittent R arm numbness that started before the knee injury. A&Ox4. Ambulatory.

## 2018-06-23 ENCOUNTER — Emergency Department (HOSPITAL_COMMUNITY): Payer: 59

## 2018-06-23 LAB — CBC WITH DIFFERENTIAL/PLATELET
ABS IMMATURE GRANULOCYTES: 0.1 10*3/uL — AB (ref 0.00–0.07)
Basophils Absolute: 0.1 10*3/uL (ref 0.0–0.1)
Basophils Relative: 1 %
Eosinophils Absolute: 0.5 10*3/uL (ref 0.0–0.5)
Eosinophils Relative: 6 %
HEMATOCRIT: 45.5 % (ref 39.0–52.0)
HEMOGLOBIN: 14.9 g/dL (ref 13.0–17.0)
Immature Granulocytes: 1 %
LYMPHS ABS: 2.2 10*3/uL (ref 0.7–4.0)
LYMPHS PCT: 24 %
MCH: 30.7 pg (ref 26.0–34.0)
MCHC: 32.7 g/dL (ref 30.0–36.0)
MCV: 93.6 fL (ref 80.0–100.0)
MONOS PCT: 9 %
Monocytes Absolute: 0.8 10*3/uL (ref 0.1–1.0)
NEUTROS ABS: 5.2 10*3/uL (ref 1.7–7.7)
NRBC: 0 % (ref 0.0–0.2)
Neutrophils Relative %: 59 %
Platelets: 188 10*3/uL (ref 150–400)
RBC: 4.86 MIL/uL (ref 4.22–5.81)
RDW: 13.3 % (ref 11.5–15.5)
WBC: 8.9 10*3/uL (ref 4.0–10.5)

## 2018-06-23 LAB — AEROBIC CULTURE W GRAM STAIN (SUPERFICIAL SPECIMEN)

## 2018-06-23 LAB — I-STAT CHEM 8, ED
BUN: 17 mg/dL (ref 6–20)
CALCIUM ION: 1.12 mmol/L — AB (ref 1.15–1.40)
CHLORIDE: 105 mmol/L (ref 98–111)
CREATININE: 0.9 mg/dL (ref 0.61–1.24)
Glucose, Bld: 92 mg/dL (ref 70–99)
HCT: 45 % (ref 39.0–52.0)
Hemoglobin: 15.3 g/dL (ref 13.0–17.0)
Potassium: 4 mmol/L (ref 3.5–5.1)
SODIUM: 140 mmol/L (ref 135–145)
TCO2: 27 mmol/L (ref 22–32)

## 2018-06-23 LAB — AEROBIC CULTURE  (SUPERFICIAL SPECIMEN)

## 2018-06-23 MED ORDER — CLINDAMYCIN PHOSPHATE 300 MG/50ML IV SOLN: 300.0000 mg | Freq: Once | INTRAVENOUS | Status: DC

## 2018-06-23 MED ORDER — CLINDAMYCIN HCL 150 MG PO CAPS: 150.0000 mg | ORAL_CAPSULE | Freq: Four times a day (QID) | ORAL | 0 refills | Status: AC

## 2018-06-23 MED ORDER — VANCOMYCIN HCL IN DEXTROSE 1-5 GM/200ML-% IV SOLN
1000.0000 mg | Freq: Once | INTRAVENOUS | Status: AC
  Administered 2018-06-23: 1000 mg via INTRAVENOUS
  Filled 2018-06-23: qty 200

## 2018-06-23 MED ORDER — OXYCODONE-ACETAMINOPHEN 5-325 MG PO TABS
1.0000 | ORAL_TABLET | Freq: Once | ORAL | Status: AC
  Administered 2018-06-23: 1 via ORAL
  Filled 2018-06-23: qty 1

## 2018-06-23 NOTE — Discharge Instructions (Signed)
We recommend that you take 300 mg clindamycin 4 times a day (2 x 150 mg tablets x 4 times a day) for 10 days. Please apply petroleum jelly-embedded gauze or self-adherent foam dressings coated with a thick layer of sterile petroleum jelly.  Return to the ER if your symptoms are getting worse. Otherwise, please follow-up with the wound care clinic by calling the number provided for a prompt appointment.

## 2018-06-23 NOTE — ED Notes (Signed)
Pt tolerating food, PO fluids.

## 2018-06-23 NOTE — ED Provider Notes (Signed)
[facility]-EMERGENCY DEPT Provider Note   CSN: [id number] Arrival date & time: 06/22/18  2332     History   Chief Complaint Chief Complaint  Patient presents with  . Knee Pain    R  . Rash    HPI Nathaniel Zuniga is a 42 y.o. male.  HPI  42 year old male comes in with chief complaint of worsening rash. Patient states that about 2 weeks ago he ended up having a rug burn on his right knee.  Over time his rash has gotten worse and there has been more erosion to his skin.  Now he is having satellite lesions.  Patient was started on doxycycline by an urgent care, and his symptoms were not responding so he came to the ER yesterday.  Patient was started on clindamycin in the ER and he has taken 4 doses.  He comes into the ER because of the new satellite lesions and increasing swelling around his knee.  He denies any progression of the skin erosion or the redness.  Review of system is negative for nausea, vomiting, fevers, chills.  Past Medical History:  Diagnosis Date  . Acute respiratory failure (HCC)   . Elevated liver enzymes   . Leukocytosis   . SIRS (systemic inflammatory response syndrome) (HCC)   . Tachycardia   . Tobacco abuse   . Volume depletion     Patient Active Problem List   Diagnosis Date Noted  . Cellulitis and abscess of right leg 12/01/2016  . Cellulitis and abscess of leg 11/30/2016  . Elevated liver enzymes 06/17/2011  . Tobacco abuse 06/10/2011    Past Surgical History:  Procedure Laterality Date  . broken nose repair    . I&D EXTREMITY Right 12/01/2016   Procedure: IRRIGATION AND DEBRIDEMENT LEG , POSSIBLE WOUND CLOSURE;  Surgeon: Durene Romans, MD;  Location: [facility] OR;  Service: Orthopedics;  Laterality: Right;        Home Medications    Prior to Admission medications   Medication Sig Start Date End Date Taking? Authorizing Provider  betamethasone dipropionate (DIPROLENE) 0.05 % cream Apply 1 application topically See admin  instructions.  APPLY A THIN LAYER TO THE AFFECTED AREA(S) BY TOPICAL ROUTE ONCE DAILY   Yes [provider]  fluocinonide (LIDEX) 0.05 % external solution Apply 1 application topically 2 (two) times daily as needed (for rashes).  12/07/17  Yes [provider]  ibuprofen (ADVIL,MOTRIN) 200 MG tablet Take 200-800 mg by mouth every 6 (six) hours as needed for moderate pain.   Yes [provider]  loratadine (CLARITIN) 10 MG tablet Take 10 mg by mouth daily as needed for allergies or itching.    Yes [provider]  triamcinolone cream (KENALOG) 0.1 % Apply 1 application topically See admin instructions. APPLY A THIN LAYER TO THE AFFECTED AREA BY TOPICAL ROUTE 2 TIMES PER DAY 06/13/18  Yes [provider]  triamcinolone cream (KENALOG) 0.5 % Apply 1 application topically See admin instructions. APPLY A THIN LAYER TO AFFECTED AREA 1-2 TIMES PER DAY AS NEEDED FOR IRRITATION 06/13/18  Yes [provider]  triamcinolone ointment (KENALOG) 0.5 % Apply 1 application topically See admin instructions. APPLY AROUND EARS IN AREAS WITH ECZEMA THREE TIMES UNTIL RESOLUTION 06/13/18  Yes [provider]  clindamycin (CLEOCIN) 150 MG capsule Take 1 capsule (150 mg total) by mouth every 6 (six) hours. 06/23/18   Derwood Kaplan, MD    Family History Family History  Problem Relation Age of Onset  .  Coronary artery disease Other     Social History Social History   Tobacco Use  . Smoking status: Former Smoker    Packs/day: 1.00    Years: 18.00    Pack years: 18.00    Types: Cigarettes    Last attempt to quit: 06/09/2011    Years since quitting: 7.0  . Smokeless tobacco: Never Used  Substance Use Topics  . Alcohol use: Yes  . Drug use: Yes    Comment: occ marijuana     Allergies   Onion; Adhesive [tape]; and Bactrim [sulfamethoxazole-trimethoprim]   Review of Systems Review of Systems  Constitutional: Positive for activity change.    Musculoskeletal: Positive for arthralgias.  Skin: Positive for rash and wound.  Allergic/Immunologic: Positive for immunocompromised state.  Hematological: Does not bruise/bleed easily.  All other systems reviewed and are negative.    Physical Exam Updated Vital Signs BP 120/82   Pulse (!) 47   Temp 97.6 F (36.4 C) (Oral)   Resp 16   SpO2 99%   Physical Exam Vitals signs and nursing note reviewed.  Constitutional:      Appearance: He is well-developed.  HENT:     Head: Atraumatic.  Neck:     Musculoskeletal: Neck supple.  Cardiovascular:     Rate and Rhythm: Normal rate.  Pulmonary:     Effort: Pulmonary effort is normal.  Musculoskeletal:        General: Swelling and tenderness present.     Comments: By the right knee patient has a large area of skin ulceration. He also has satellite lesions that are erythematous and mild erythema surrounding the skin ulceration.  Tenderness to palpation without crepitus  Skin:    General: Skin is warm.  Neurological:     Mental Status: He is alert and oriented to person, place, and time.      ED Treatments / Results  Labs (all labs ordered are listed, but only abnormal results are displayed) Labs Reviewed  CBC WITH DIFFERENTIAL/PLATELET - Abnormal; Notable for the following components:      Result Value   Abs Immature Granulocytes 0.10 (*)    All other components within normal limits  I-STAT CHEM 8, ED - Abnormal; Notable for the following components:   Calcium, Ion 1.12 (*)    All other components within normal limits    EKG None  Radiology Dg Chest Port 1 View  Result Date: 06/23/2018 CLINICAL DATA:  Weakness. EXAM: PORTABLE CHEST 1 VIEW COMPARISON:  Chest radiograph June 30, 2011 FINDINGS: Cardiomediastinal silhouette is normal. No pleural effusions or focal consolidations. Trachea projects midline and there is no pneumothorax. Soft tissue planes and included osseous structures are non-suspicious. IMPRESSION:  Negative. Electronically Signed   By: Awilda Metro M.D.   On: 06/23/2018 03:07    Procedures Procedures (including critical care time)         Medications Ordered in ED Medications  oxyCODONE-acetaminophen (PERCOCET/ROXICET) 5-325 MG per tablet 1 tablet (1 tablet Oral Given 06/23/18 0450)  vancomycin (VANCOCIN) IVPB 1000 mg/200 mL premix (0 mg Intravenous Stopped 06/23/18 0717)     Initial Impression / Assessment and Plan / ED Course  I have reviewed the triage vital signs and the nursing notes.  Pertinent labs & imaging results that were available during my care of the patient were reviewed by me and considered in my medical decision making (see chart for details).     Immunocompetent patient comes in a chief complaint of worsening skin lesion. It seems  like his symptoms started 2 weeks ago with a small bump that he got from rug burn.  Over time his lesion started expanding and he finally went to urgent care and was given doxycycline.  He now has new satellite lesions which got him concerned.  He was seen in the ER recently and started on clindamycin.  Patient has no nausea, vomiting, fevers, chills.  He is able to flex and extend the knee, therefore I do not think he has any septic arthritis.  Given that patient symptoms have not improved dramatically despite taking full course of doxycycline and 1 day of clindamycin, I gave the patient the option of choosing between aggressive option of being admitted with IV antibiotics versus conservative option of getting IV antibiotics in the ER with higher dose of oral clindamycin and strict ER return precautions if things get worse.  Patient is choosing the latter option at this time. We will give him IV vancomycin and I have discharged him with more clindamycin so that he can double his clindamycin dose.  We have also advised him to take clindamycin for full 10 days.  I have given him follow-up with wound care.  Strict ER return  precautions have been discussed.  The wound has been demarcated by the nurse before patient was discharged.  I suspect the patient has impetigo versus SSSS.  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

## 2018-06-24 ENCOUNTER — Telehealth: Payer: Self-pay

## 2018-06-24 NOTE — Telephone Encounter (Signed)
Post ED Visit - Positive Culture Follow-up  Culture report reviewed by antimicrobial stewardship pharmacist:  []  Nathaniel Zuniga, Pharm.D. []  Nathaniel Zuniga, Pharm.D., BCPS AQ-ID []  Nathaniel Zuniga, Pharm.D., BCPS []  Nathaniel Zuniga, Pharm.D., BCPS []  Nathaniel Zuniga, [address]Ph[address]D., BCPS, AAHIVP []  Nathaniel Zuniga, Pharm.D., BCPS, AAHIVP []  Nathaniel Zuniga, PharmD, BCPS []  Nathaniel Zuniga, PharmD, BCPS []  Nathaniel Zuniga, PharmD, BCPS [x]  Nathaniel Zuniga, PharmD  Positive aerobic culture Treated with Clindamycin doxycycline , organism sensitive to the same and no further patient follow-up is required at this time.  Nathaniel Zuniga, Nathaniel Zuniga 06/24/2018, 9:28 AM

## 2018-06-25 ENCOUNTER — Other Ambulatory Visit: Payer: Self-pay

## 2018-06-25 ENCOUNTER — Encounter (HOSPITAL_COMMUNITY): Payer: Self-pay

## 2018-06-25 ENCOUNTER — Emergency Department (HOSPITAL_COMMUNITY)
Admission: EM | Admit: 2018-06-25 | Discharge: 2018-06-25 | Disposition: A | Discharge status: Home / Self Care | Payer: 59 | Attending: Emergency Medicine | Admitting: Emergency Medicine

## 2018-06-25 DIAGNOSIS — Z87891 Personal history of nicotine dependence: Secondary | ICD-10-CM | POA: Diagnosis not present

## 2018-06-25 DIAGNOSIS — L259 Unspecified contact dermatitis, unspecified cause: Secondary | ICD-10-CM | POA: Insufficient documentation

## 2018-06-25 DIAGNOSIS — Z5189 Encounter for other specified aftercare: Secondary | ICD-10-CM

## 2018-06-25 DIAGNOSIS — Z4800 Encounter for change or removal of nonsurgical wound dressing: Secondary | ICD-10-CM | POA: Insufficient documentation

## 2018-06-25 DIAGNOSIS — R21 Rash and other nonspecific skin eruption: Secondary | ICD-10-CM | POA: Diagnosis present

## 2018-06-25 DIAGNOSIS — Z79899 Other long term (current) drug therapy: Secondary | ICD-10-CM | POA: Diagnosis not present

## 2018-06-25 MED ORDER — PREDNISONE 10 MG PO TABS: ORAL_TABLET | ORAL | 0 refills | Status: DC

## 2018-06-25 MED ORDER — PREDNISONE 20 MG PO TABS
60.0000 mg | ORAL_TABLET | Freq: Once | ORAL | Status: DC
  Filled 2018-06-25: qty 3

## 2018-06-25 NOTE — ED Provider Notes (Signed)
[facility] EMERGENCY DEPARTMENT Provider Note   CSN: [id number] Arrival date & time: 06/25/18  1547   History   Chief Complaint Chief Complaint  Patient presents with  . Rash  . Wound Check    HPI JERRETT BALDINGER is a 42 y.o. male.  HPI Patient is a 42 year old male with no significant past medical history who presents to the emergency department for evaluation of rash as well as wound to the right knee.  Patient reports scraping his right knee on carpet approximately 2 weeks ago.  Has been evaluated multiple times since due to concern for skin infection.  Had been applying peroxide and Neosporin and was given doxycycline earlier this month for this.  Completed a course of doxycycline and was later reevaluated and started on clindamycin.  Was also evaluated on 12/14 in the emergency department and given a dose of IV vancomycin and offered admission for IV antibiotics however chose to be discharged with higher dose clindamycin.  Patient returns today with persistent redness to wound on right knee which he thinks is improving.  Also with new rash to his bilateral hands, bilateral upper extremities, bilateral lower extremities which itches.  Has been present for couple of days.  Reports that his bilateral hands appears to be swelling due to the rash.  States he has been using a new soap to wash his wound with basis could also be related.  Also has a dog that does play outside sometimes and potentially could have been exposed to poison ivy though it is late in the season for this.  No fevers or chills.  Describes burning sensation to the right knee and itchiness of the rash.  No fevers or chills.  Full range of motion of the knee without pain.  Past Medical History:  Diagnosis Date  . Acute respiratory failure (HCC)   . Elevated liver enzymes   . Leukocytosis   . SIRS (systemic inflammatory response syndrome) (HCC)   . Tachycardia   . Tobacco abuse   . Volume depletion      Patient Active Problem List   Diagnosis Date Noted  . Cellulitis and abscess of right leg 12/01/2016  . Cellulitis and abscess of leg 11/30/2016  . Elevated liver enzymes 06/17/2011  . Tobacco abuse 06/10/2011    Past Surgical History:  Procedure Laterality Date  . broken nose repair    . I&D EXTREMITY Right 12/01/2016   Procedure: IRRIGATION AND DEBRIDEMENT LEG , POSSIBLE WOUND CLOSURE;  Surgeon: Durene Romans, MD;  Location: [facility] OR;  Service: Orthopedics;  Laterality: Right;        Home Medications    Prior to Admission medications   Medication Sig Start Date End Date Taking? Authorizing Provider  betamethasone dipropionate (DIPROLENE) 0.05 % cream Apply 1 application topically See admin instructions.  APPLY A THIN LAYER TO THE AFFECTED AREA(S) BY TOPICAL ROUTE ONCE DAILY    [provider]  clindamycin (CLEOCIN) 150 MG capsule Take 1 capsule (150 mg total) by mouth every 6 (six) hours. 06/23/18   Derwood Kaplan, MD  fluocinonide (LIDEX) 0.05 % external solution Apply 1 application topically 2 (two) times daily as needed (for rashes).  12/07/17   [provider]  ibuprofen (ADVIL,MOTRIN) 200 MG tablet Take 200-800 mg by mouth every 6 (six) hours as needed for moderate pain.    [provider]  loratadine (CLARITIN) 10 MG tablet Take 10 mg by mouth daily as needed for allergies or itching.  [provider]  predniSONE (DELTASONE) 10 MG tablet Days 1-4 Take 50mg  (5 tablets) daily Days 5-8 Take 40mg  (4 tablets) daily Days 9-12 Take 30mg  (3 tablets) daily Days 13-16 Take 20mg  (2 tablets) daily Days 17-20 Take 10mg  (1 tablet) daily 06/25/18   Rigoberto Noelickens, Lorella Gomez, MD  triamcinolone cream (KENALOG) 0.1 % Apply 1 application topically See admin instructions. APPLY A THIN LAYER TO THE AFFECTED AREA BY TOPICAL ROUTE 2 TIMES PER DAY 06/13/18   [provider]  triamcinolone cream (KENALOG) 0.5 % Apply 1 application topically See admin  instructions. APPLY A THIN LAYER TO AFFECTED AREA 1-2 TIMES PER DAY AS NEEDED FOR IRRITATION 06/13/18   [provider]  triamcinolone ointment (KENALOG) 0.5 % Apply 1 application topically See admin instructions. APPLY AROUND EARS IN AREAS WITH ECZEMA THREE TIMES UNTIL RESOLUTION 06/13/18   [provider]    Family History Family History  Problem Relation Age of Onset  . Coronary artery disease Other     Social History Social History   Tobacco Use  . Smoking status: Former Smoker    Packs/day: 1.00    Years: 18.00    Pack years: 18.00    Types: Cigarettes    Last attempt to quit: 06/09/2011    Years since quitting: 7.0  . Smokeless tobacco: Never Used  Substance Use Topics  . Alcohol use: Yes  . Drug use: Yes    Comment: occ marijuana     Allergies   Onion; Adhesive [tape]; and Bactrim [sulfamethoxazole-trimethoprim]   Review of Systems Review of Systems  Constitutional: Negative for chills and fever.  HENT: Negative for congestion.   Respiratory: Negative for cough and shortness of breath.   Cardiovascular: Negative for leg swelling.  Gastrointestinal: Negative for abdominal pain, diarrhea, nausea and vomiting.  Musculoskeletal: Negative for back pain and neck pain.  Skin: Positive for rash and wound.  Neurological: Negative for weakness and numbness.  All other systems reviewed and are negative.    Physical Exam Updated Vital Signs BP 129/89 (BP Location: Right Arm)   Pulse 67   Temp 98 F (36.7 C) (Oral)   Resp 18   SpO2 98%   Physical Exam Vitals signs and nursing note reviewed.  Constitutional:      General: He is not in acute distress.    Appearance: He is not ill-appearing or toxic-appearing.  HENT:     Head: Normocephalic and atraumatic.  Eyes:     General:        Right eye: No discharge.        Left eye: No discharge.     Conjunctiva/sclera: Conjunctivae normal.  Neck:     Musculoskeletal: Normal range of motion.      Trachea: No tracheal deviation.  Cardiovascular:     Rate and Rhythm: Regular rhythm.     Heart sounds: Normal heart sounds.  Pulmonary:     Effort: Pulmonary effort is normal. No respiratory distress.     Breath sounds: Normal breath sounds.  Abdominal:     General: Bowel sounds are normal. There is no distension.     Palpations: Abdomen is soft.     Tenderness: There is no abdominal tenderness. There is no guarding or rebound.  Musculoskeletal:        General: No deformity.  Skin:    Findings: No rash.     Comments: Patient with post card sized area of erythema to the right medial knee.  Erythematous though when compared to recent  visit pictures no longer beefy red appearing.  No drainage or oozing.  No significant joint effusion.  Full range of motion of the knee without pain.  Warm to touch.  Patient with scattered rash that is raised.  Nonerythematous.  Signs of excoriation.  No oozing.  Present on bilateral palms bilateral arms, and bilateral thighs.  No purulence.  Nonblanching.   Neurological:     Mental Status: He is alert.     Motor: No abnormal muscle tone.      ED Treatments / Results  Labs (all labs ordered are listed, but only abnormal results are displayed) Labs Reviewed - No data to display  EKG None  Radiology No results found.  Procedures Procedures (including critical care time)  Medications Ordered in ED Medications  predniSONE (DELTASONE) tablet 60 mg (has no administration in time range)     Initial Impression / Assessment and Plan / ED Course  I have reviewed the triage vital signs and the nursing notes.  Pertinent labs & imaging results that were available during my care of the patient were reviewed by me and considered in my medical decision making (see chart for details).    Patient is a 42 year old male with no significant past medical history who presents to the emergency department for evaluation of rash as well as wound to the right  knee.   Patient is afebrile and hemodynamically stable at presentation.  History and exam as detailed above.  Nontoxic-appearing.   Wound to the right knee appears to be improving when compared to most recent pictures in chart.  No longer deeply erythematous.  No findings to suggest septic arthritis at this time.  Does not appear acutely cellulitic.  Given clinical improvement with clindamycin advised patient to continue this medication.  I am concerned that he could be having contact dermatitis possibly related to frequent Neosporin application at home.  Advised patient to discontinue use of this and any other topical lotions/emollients he has been applying.  Advised fragrance free soap and water and close monitoring at home.  No indication for IV antibiotics or admission at this time.  Most recent labs unremarkable.  No indication for repeat labs at this time.  Patient scattered rash to bilateral upper and lower extremities and hands is more consistent with contact dermatitis at this time.  It is very pruritic according to patient.  Not consistent with scabies.  Not consistent with RMSF/syphilis.  Will discharge with prolonged course of prednisone.  Patient counseled on this.  Strict return precautions given.  Stable at discharge.  Case and plan of care discussed with Dr. Adela Lank.   Final Clinical Impressions(s) / ED Diagnoses   Final diagnoses:  Contact dermatitis, unspecified contact dermatitis type, unspecified trigger  Encounter for wound re-check    ED Discharge Orders         Ordered    predniSONE (DELTASONE) 10 MG tablet     06/25/18 1952           Rigoberto Noel, MD 06/25/18 1955    Melene Plan, DO 06/25/18 2256

## 2018-06-25 NOTE — Discharge Instructions (Signed)
Recommend using fragrance free soaps.  Recommend avoiding Neosporin ointment to the affected area as this could be causing allergic reaction.  Take prednisone for full course of prescription following dosing instructions on packaging to taper off of this medication slowly.

## 2018-06-25 NOTE — ED Triage Notes (Signed)
Pt reports rash to arms, back, legs that started last Thursday. Pt also c.o itching and rash to his hands that all started after taking clindamycin for a wound infection on his right knee. Pt seen here 3 times within the last 2 weeks for the same.

## 2018-07-05 ENCOUNTER — Encounter (HOSPITAL_BASED_OUTPATIENT_CLINIC_OR_DEPARTMENT_OTHER): Payer: 59 | Attending: Internal Medicine

## 2022-11-20 ENCOUNTER — Other Ambulatory Visit: Payer: Self-pay

## 2022-11-20 ENCOUNTER — Emergency Department (HOSPITAL_COMMUNITY)
Admission: EM | Admit: 2022-11-20 | Discharge: 2022-11-20 | Discharge status: Against Medical Advice | Payer: No Typology Code available for payment source | Attending: Emergency Medicine | Admitting: Emergency Medicine

## 2022-11-20 ENCOUNTER — Encounter (HOSPITAL_COMMUNITY): Payer: Self-pay

## 2022-11-20 DIAGNOSIS — S80812A Abrasion, left lower leg, initial encounter: Secondary | ICD-10-CM | POA: Diagnosis not present

## 2022-11-20 DIAGNOSIS — Y92410 Unspecified street and highway as the place of occurrence of the external cause: Secondary | ICD-10-CM | POA: Insufficient documentation

## 2022-11-20 DIAGNOSIS — M545 Low back pain, unspecified: Secondary | ICD-10-CM | POA: Diagnosis not present

## 2022-11-20 DIAGNOSIS — S40811A Abrasion of right upper arm, initial encounter: Secondary | ICD-10-CM | POA: Diagnosis present

## 2022-11-20 DIAGNOSIS — Z5329 Procedure and treatment not carried out because of patient's decision for other reasons: Secondary | ICD-10-CM | POA: Insufficient documentation

## 2022-11-20 DIAGNOSIS — R109 Unspecified abdominal pain: Secondary | ICD-10-CM | POA: Insufficient documentation

## 2022-11-20 DIAGNOSIS — M546 Pain in thoracic spine: Secondary | ICD-10-CM | POA: Diagnosis not present

## 2022-11-20 NOTE — ED Notes (Signed)
Pt. Refused IV and blood work. Pt. Advised that in order to have CT done with contrast IV was necessary. Pt. Understood risk of refusing. Provider notified.
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
## 2023-03-21 ENCOUNTER — Emergency Department (HOSPITAL_COMMUNITY): Payer: No Typology Code available for payment source

## 2023-03-21 ENCOUNTER — Other Ambulatory Visit: Payer: Self-pay

## 2023-03-21 ENCOUNTER — Encounter (HOSPITAL_COMMUNITY): Payer: Self-pay | Admitting: Emergency Medicine

## 2023-03-21 ENCOUNTER — Emergency Department (HOSPITAL_COMMUNITY)
Admission: EM | Admit: 2023-03-21 | Discharge: 2023-03-21 | Disposition: A | Discharge status: Home / Self Care | Payer: No Typology Code available for payment source | Attending: Emergency Medicine | Admitting: Emergency Medicine

## 2023-03-21 DIAGNOSIS — R059 Cough, unspecified: Secondary | ICD-10-CM | POA: Diagnosis not present

## 2023-03-21 DIAGNOSIS — M25511 Pain in right shoulder: Secondary | ICD-10-CM | POA: Insufficient documentation

## 2023-03-21 DIAGNOSIS — I10 Essential (primary) hypertension: Secondary | ICD-10-CM | POA: Diagnosis not present

## 2023-03-21 DIAGNOSIS — R0789 Other chest pain: Secondary | ICD-10-CM

## 2023-03-21 LAB — BASIC METABOLIC PANEL
Anion gap: 9 (ref 5–15)
BUN: 10 mg/dL (ref 6–20)
CO2: 25 mmol/L (ref 22–32)
Calcium: 8.8 mg/dL — ABNORMAL LOW (ref 8.9–10.3)
Chloride: 104 mmol/L (ref 98–111)
Creatinine, Ser: 0.97 mg/dL (ref 0.61–1.24)
GFR, Estimated: >60 mL/min (ref >60)
Glucose, Bld: 115 mg/dL — ABNORMAL HIGH (ref 70–99)
Potassium: 3.7 mmol/L (ref 3.5–5.1)
Sodium: 138 mmol/L (ref 135–145)

## 2023-03-21 LAB — CBC
HCT: 48 % (ref 39.0–52.0)
Hemoglobin: 16.1 g/dL (ref 13.0–17.0)
MCH: 30.6 pg (ref 26.0–34.0)
MCHC: 33.5 g/dL (ref 30.0–36.0)
MCV: 91.3 fL (ref 80.0–100.0)
Platelets: 180 10*3/uL (ref 150–400)
RBC: 5.26 MIL/uL (ref 4.22–5.81)
RDW: 13.5 % (ref 11.5–15.5)
WBC: 6.4 10*3/uL (ref 4.0–10.5)
nRBC: 0 % (ref 0.0–0.2)

## 2023-03-21 LAB — TROPONIN I (HIGH SENSITIVITY)
Troponin I (High Sensitivity): 5 ng/L (ref <18)
Troponin I (High Sensitivity): 4 ng/L (ref <18)

## 2023-03-21 MED ORDER — PREDNISONE 10 MG PO TABS: ORAL_TABLET | ORAL | 0 refills | Status: AC
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
## 2023-03-21 NOTE — Discharge Instructions (Addendum)
See your Physician for recheck of your blood pressure
